# Patient Record
Sex: Female | Born: 1950 | Hispanic: Yes | Marital: Married | Smoking: Never smoker
Health system: Southern US, Community
[De-identification: ages and names within clinical notes are randomized; demographics above are authoritative.]

## PROBLEM LIST (undated history)

## (undated) DIAGNOSIS — E039 Hypothyroidism, unspecified: Secondary | ICD-10-CM

## (undated) DIAGNOSIS — I351 Nonrheumatic aortic (valve) insufficiency: Secondary | ICD-10-CM

## (undated) DIAGNOSIS — I5189 Other ill-defined heart diseases: Secondary | ICD-10-CM

## (undated) DIAGNOSIS — R7303 Prediabetes: Secondary | ICD-10-CM

## (undated) DIAGNOSIS — R768 Other specified abnormal immunological findings in serum: Secondary | ICD-10-CM

## (undated) DIAGNOSIS — I420 Dilated cardiomyopathy: Secondary | ICD-10-CM

## (undated) DIAGNOSIS — E079 Disorder of thyroid, unspecified: Secondary | ICD-10-CM

## (undated) DIAGNOSIS — I10 Essential (primary) hypertension: Secondary | ICD-10-CM

## (undated) DIAGNOSIS — I340 Nonrheumatic mitral (valve) insufficiency: Secondary | ICD-10-CM

## (undated) DIAGNOSIS — I071 Rheumatic tricuspid insufficiency: Secondary | ICD-10-CM

## (undated) DIAGNOSIS — I8393 Asymptomatic varicose veins of bilateral lower extremities: Secondary | ICD-10-CM

## (undated) HISTORY — DX: Asymptomatic varicose veins of bilateral lower extremities: I83.93

## (undated) HISTORY — PX: ABDOMINAL HYSTERECTOMY: SHX81

## (undated) HISTORY — PX: CORONARY ARTERY BYPASS GRAFT: SHX141

## (undated) HISTORY — DX: Other specified abnormal immunological findings in serum: R76.8

## (undated) HISTORY — PX: CHOLECYSTECTOMY: SHX55

## (undated) HISTORY — PX: WRIST FRACTURE SURGERY: SHX121

## (undated) HISTORY — PX: CARDIAC SURGERY: SHX584

## (undated) HISTORY — DX: Hypothyroidism, unspecified: E03.9

---

## 1982-03-06 DIAGNOSIS — Z8774 Personal history of (corrected) congenital malformations of heart and circulatory system: Secondary | ICD-10-CM

## 1982-03-06 HISTORY — DX: Personal history of (corrected) congenital malformations of heart and circulatory system: Z87.74

## 1982-03-06 HISTORY — PX: ASD REPAIR: SHX258

## 2004-04-06 ENCOUNTER — Ambulatory Visit: Payer: Self-pay

## 2006-01-26 ENCOUNTER — Emergency Department: Payer: Self-pay | Admitting: General Practice

## 2007-10-15 ENCOUNTER — Ambulatory Visit: Payer: Self-pay

## 2009-01-19 ENCOUNTER — Ambulatory Visit: Payer: Self-pay

## 2010-01-19 ENCOUNTER — Ambulatory Visit: Payer: Self-pay

## 2010-02-22 ENCOUNTER — Ambulatory Visit: Payer: Self-pay | Admitting: Family Medicine

## 2011-03-29 ENCOUNTER — Ambulatory Visit: Payer: Self-pay

## 2012-07-23 ENCOUNTER — Ambulatory Visit: Payer: Self-pay

## 2014-01-12 ENCOUNTER — Emergency Department: Payer: Self-pay | Admitting: Internal Medicine

## 2014-01-12 LAB — CBC
HCT: 39.1 % (ref 35.0–47.0)
HGB: 13.3 g/dL (ref 12.0–16.0)
MCH: 28 pg (ref 26.0–34.0)
MCHC: 33.9 g/dL (ref 32.0–36.0)
MCV: 83 fL (ref 80–100)
Platelet: 211 10*3/uL (ref 150–440)
RBC: 4.74 10*6/uL (ref 3.80–5.20)
RDW: 14.2 % (ref 11.5–14.5)
WBC: 9.4 10*3/uL (ref 3.6–11.0)

## 2014-01-12 LAB — BASIC METABOLIC PANEL
Anion Gap: 6 — ABNORMAL LOW (ref 7–16)
BUN: 16 mg/dL (ref 7–18)
CALCIUM: 8.4 mg/dL — AB (ref 8.5–10.1)
Chloride: 104 mmol/L (ref 98–107)
Co2: 30 mmol/L (ref 21–32)
Creatinine: 0.59 mg/dL — ABNORMAL LOW (ref 0.60–1.30)
EGFR (Non-African Amer.): >60
Glucose: 108 mg/dL — ABNORMAL HIGH (ref 65–99)
Osmolality: 281 (ref 275–301)
Potassium: 3.9 mmol/L (ref 3.5–5.1)
SODIUM: 140 mmol/L (ref 136–145)

## 2014-01-12 LAB — TROPONIN I: Troponin-I: <0.02 ng/mL

## 2014-01-13 LAB — TROPONIN I: Troponin-I: <0.02 ng/mL

## 2014-01-21 ENCOUNTER — Ambulatory Visit: Payer: Self-pay

## 2014-03-06 HISTORY — PX: NM MYOVIEW LTD: HXRAD82

## 2014-04-22 DIAGNOSIS — E039 Hypothyroidism, unspecified: Secondary | ICD-10-CM | POA: Insufficient documentation

## 2014-04-22 DIAGNOSIS — M79672 Pain in left foot: Secondary | ICD-10-CM

## 2014-04-22 DIAGNOSIS — I38 Endocarditis, valve unspecified: Secondary | ICD-10-CM | POA: Insufficient documentation

## 2014-04-22 DIAGNOSIS — G8929 Other chronic pain: Secondary | ICD-10-CM | POA: Insufficient documentation

## 2014-05-16 ENCOUNTER — Emergency Department: Payer: Self-pay | Admitting: Student

## 2014-05-18 ENCOUNTER — Emergency Department: Payer: Self-pay | Admitting: Emergency Medicine

## 2014-05-21 ENCOUNTER — Encounter: Payer: Self-pay | Admitting: Primary Care

## 2014-05-27 ENCOUNTER — Ambulatory Visit: Payer: Self-pay | Admitting: Primary Care

## 2014-06-08 ENCOUNTER — Emergency Department: Admit: 2014-06-08 | Disposition: A | Discharge status: Home / Self Care | Payer: Self-pay | Admitting: Student

## 2014-06-09 ENCOUNTER — Emergency Department
Admit: 2014-06-09 | Disposition: A | Discharge status: Home / Self Care | Payer: Self-pay | Admitting: Emergency Medicine

## 2014-06-30 ENCOUNTER — Ambulatory Visit
Admit: 2014-06-30 | Disposition: A | Discharge status: Home / Self Care | Payer: Self-pay | Attending: Orthopedic Surgery | Admitting: Orthopedic Surgery

## 2014-07-13 DIAGNOSIS — M47812 Spondylosis without myelopathy or radiculopathy, cervical region: Secondary | ICD-10-CM | POA: Insufficient documentation

## 2014-10-20 DIAGNOSIS — G629 Polyneuropathy, unspecified: Secondary | ICD-10-CM | POA: Insufficient documentation

## 2014-12-02 ENCOUNTER — Other Ambulatory Visit: Payer: Self-pay | Admitting: Primary Care

## 2014-12-02 DIAGNOSIS — Z13820 Encounter for screening for osteoporosis: Secondary | ICD-10-CM

## 2014-12-15 ENCOUNTER — Ambulatory Visit: Payer: Self-pay

## 2014-12-17 ENCOUNTER — Ambulatory Visit
Admission: RE | Admit: 2014-12-17 | Discharge: 2014-12-17 | Disposition: A | Discharge status: Home / Self Care | Payer: Medicare Other | Source: Ambulatory Visit | Attending: Primary Care | Admitting: Primary Care

## 2014-12-17 DIAGNOSIS — Z13820 Encounter for screening for osteoporosis: Secondary | ICD-10-CM | POA: Diagnosis not present

## 2014-12-17 DIAGNOSIS — Z780 Asymptomatic menopausal state: Secondary | ICD-10-CM | POA: Diagnosis not present

## 2014-12-17 DIAGNOSIS — M8580 Other specified disorders of bone density and structure, unspecified site: Secondary | ICD-10-CM | POA: Diagnosis not present

## 2015-12-20 ENCOUNTER — Other Ambulatory Visit: Payer: Self-pay | Admitting: Neurology

## 2015-12-20 DIAGNOSIS — M542 Cervicalgia: Secondary | ICD-10-CM

## 2015-12-27 ENCOUNTER — Other Ambulatory Visit: Payer: Self-pay | Admitting: Primary Care

## 2015-12-27 DIAGNOSIS — Z1231 Encounter for screening mammogram for malignant neoplasm of breast: Secondary | ICD-10-CM

## 2015-12-31 ENCOUNTER — Ambulatory Visit: Payer: Medicare Other

## 2016-01-17 ENCOUNTER — Other Ambulatory Visit: Payer: Self-pay | Admitting: Physical Medicine and Rehabilitation

## 2016-01-17 DIAGNOSIS — M5136 Other intervertebral disc degeneration, lumbar region: Secondary | ICD-10-CM

## 2016-01-27 ENCOUNTER — Ambulatory Visit: Payer: Medicare Other

## 2016-01-28 ENCOUNTER — Ambulatory Visit: Admission: RE | Admit: 2016-01-28 | Payer: Medicare Other | Source: Ambulatory Visit

## 2016-01-31 ENCOUNTER — Ambulatory Visit: Payer: Medicare Other | Attending: Primary Care

## 2016-02-02 ENCOUNTER — Ambulatory Visit
Admission: RE | Admit: 2016-02-02 | Discharge: 2016-02-02 | Disposition: A | Discharge status: Home / Self Care | Payer: Medicare Other | Source: Ambulatory Visit | Attending: Physical Medicine and Rehabilitation | Admitting: Physical Medicine and Rehabilitation

## 2016-02-02 DIAGNOSIS — M48061 Spinal stenosis, lumbar region without neurogenic claudication: Secondary | ICD-10-CM | POA: Diagnosis not present

## 2016-02-02 DIAGNOSIS — M5136 Other intervertebral disc degeneration, lumbar region: Secondary | ICD-10-CM | POA: Insufficient documentation

## 2016-03-03 ENCOUNTER — Emergency Department
Admission: EM | Admit: 2016-03-03 | Discharge: 2016-03-03 | Disposition: A | Discharge status: Home / Self Care | Payer: Medicare Other | Attending: Emergency Medicine | Admitting: Emergency Medicine

## 2016-03-03 ENCOUNTER — Emergency Department: Payer: Medicare Other

## 2016-03-03 ENCOUNTER — Encounter: Payer: Self-pay | Admitting: Emergency Medicine

## 2016-03-03 DIAGNOSIS — R0789 Other chest pain: Secondary | ICD-10-CM | POA: Diagnosis present

## 2016-03-03 DIAGNOSIS — I10 Essential (primary) hypertension: Secondary | ICD-10-CM | POA: Diagnosis not present

## 2016-03-03 HISTORY — DX: Essential (primary) hypertension: I10

## 2016-03-03 HISTORY — DX: Disorder of thyroid, unspecified: E07.9

## 2016-03-03 LAB — BASIC METABOLIC PANEL
Anion gap: 7 (ref 5–15)
BUN: 11 mg/dL (ref 6–20)
CHLORIDE: 105 mmol/L (ref 101–111)
CO2: 28 mmol/L (ref 22–32)
CREATININE: 0.53 mg/dL (ref 0.44–1.00)
Calcium: 8.9 mg/dL (ref 8.9–10.3)
GFR calc Af Amer: >60 mL/min (ref >60)
GFR calc non Af Amer: >60 mL/min (ref >60)
GLUCOSE: 105 mg/dL — AB (ref 65–99)
POTASSIUM: 3.5 mmol/L (ref 3.5–5.1)
Sodium: 140 mmol/L (ref 135–145)

## 2016-03-03 LAB — CBC
HEMATOCRIT: 37.8 % (ref 35.0–47.0)
Hemoglobin: 13.4 g/dL (ref 12.0–16.0)
MCH: 28.8 pg (ref 26.0–34.0)
MCHC: 35.4 g/dL (ref 32.0–36.0)
MCV: 81.2 fL (ref 80.0–100.0)
PLATELETS: 261 10*3/uL (ref 150–440)
RBC: 4.65 MIL/uL (ref 3.80–5.20)
RDW: 14.7 % — AB (ref 11.5–14.5)
WBC: 6.9 10*3/uL (ref 3.6–11.0)

## 2016-03-03 LAB — TROPONIN I
Troponin I: <0.03 ng/mL (ref <0.03)
Troponin I: <0.03 ng/mL (ref <0.03)

## 2016-03-03 MED ORDER — NAPROXEN 500 MG PO TABS: 500.0000 mg | ORAL_TABLET | Freq: Two times a day (BID) | ORAL | 2 refills | Status: DC

## 2016-03-03 MED ORDER — KETOROLAC TROMETHAMINE 30 MG/ML IJ SOLN
30.0000 mg | Freq: Once | INTRAMUSCULAR | Status: AC
  Administered 2016-03-03: 30 mg via INTRAMUSCULAR

## 2016-03-03 MED ORDER — KETOROLAC TROMETHAMINE 30 MG/ML IJ SOLN
INTRAMUSCULAR | Status: AC
  Administered 2016-03-03: 30 mg via INTRAMUSCULAR
  Filled 2016-03-03: qty 1

## 2016-03-03 NOTE — ED Provider Notes (Signed)
[facility] Emergency Department Provider Note   ____________________________________________    I have reviewed the triage vital signs and the nursing notes.   HISTORY  Chief Complaint Chest Pain  Interpreter used   HPI Kristi Conrad is a [age] y.o. female who presents with complaints of chest pain which started yesterday. She feels it is related to being hugged too tightly by her son and or possibly depression. She denies SI or HI. She denies diaphoresis or shortness of breath. No recent travel. No calf pain or swelling. No abdominal pain. She reports the pain as aching sensation worse with pressure on her left chest wall   Past Medical History:  Diagnosis Date  . Hypertension   . Thyroid disease     There are no active problems to display for this patient.   Past Surgical History:  Procedure Laterality Date  . ABDOMINAL HYSTERECTOMY    . CARDIAC SURGERY      Prior to Admission medications   Medication Sig Start Date End Date Taking? Authorizing Provider  ibuprofen (ADVIL,MOTRIN) 200 MG tablet Take 200 mg by mouth every 4 (four) hours as needed.   Yes Historical Provider, MD  naproxen (NAPROSYN) 500 MG tablet Take 1 tablet (500 mg total) by mouth 2 (two) times daily with a meal. 03/03/16   Jene Everyobert Aahana Elza, MD     Allergies Patient has no known allergies.  History reviewed. No pertinent family history.  Social History Social History  Substance Use Topics  . Smoking status: Never Smoker  . Smokeless tobacco: Never Used  . Alcohol use Yes    Review of Systems  Constitutional: No fever/chills  Cardiovascular: As above Respiratory: Denies shortness of breath. Gastrointestinal: No abdominal pain.  Genitourinary: Negative for dysuria. Musculoskeletal: Negative for back pain. Skin: Negative for rash. Neurological: Negative for headaches  10-point ROS otherwise  negative.  ____________________________________________   PHYSICAL EXAM:  VITAL SIGNS: ED Triage Vitals  Enc Vitals Group     BP 03/03/16 0955 (!) 151/70     Pulse Rate 03/03/16 0955 61     Resp 03/03/16 0955 18     Temp 03/03/16 0955 97.5 F (36.4 C)     Temp Source 03/03/16 0955 Oral     SpO2 03/03/16 0955 98 %     Weight 03/03/16 0956 145 lb (65.8 kg)     Height 03/03/16 0956 5\' 5"  (1.651 m)     Head Circumference --      Peak Flow --      Pain Score 03/03/16 0956 8     Pain Loc --      Pain Edu? --      Excl. in GC? --     Constitutional: Alert and oriented. No acute distress. Pleasant and interactive Eyes: Conjunctivae are normal.   Mouth/Throat: Mucous membranes are moist.   Neck:  Painless ROM Cardiovascular: Normal rate, regular rhythm. Grossly normal heart sounds.  Good peripheral circulation. Tender to palpation along the left lateral chest wall above the left breast, no rash Respiratory: Normal respiratory effort.  No retractions. Lungs CTAB. Gastrointestinal: Soft and nontender. No distention.  No CVA tenderness. Genitourinary: deferred Musculoskeletal: No lower extremity tenderness nor edema.  Warm and well perfused Neurologic: No gross focal neurologic deficits are appreciated.  Skin:  Skin is warm, dry and intact.  Psychiatric: Mood and affect are normal. Speech and behavior are normal.  ____________________________________________   LABS (all labs ordered are listed, but only  abnormal results are displayed)  Labs Reviewed  BASIC METABOLIC PANEL - Abnormal; Notable for the following:       Result Value   Glucose, Bld 105 (*)    All other components within normal limits  CBC - Abnormal; Notable for the following:    RDW 14.7 (*)    All other components within normal limits  TROPONIN I  TROPONIN I   ____________________________________________  EKG  ED ECG REPORT I, Jene EveryKINNER, Alandis Bluemel, the attending physician, personally viewed and interpreted  this ECG.  Date: 03/03/2016 EKG Time: 9:47 AM Rate: 54 Rhythm: Sinus bradycardia QRS Axis: normal Intervals: normal ST/T Wave abnormalities: normal Conduction Disturbances: none Narrative Interpretation: unremarkable  ____________________________________________  RADIOLOGY  Chest x-ray unremarkable ____________________________________________   PROCEDURES  Procedure(s) performed: No    Critical Care performed: No ____________________________________________   INITIAL IMPRESSION / ASSESSMENT AND PLAN / ED COURSE  Pertinent labs & imaging results that were available during my care of the patient were reviewed by me and considered in my medical decision making (see chart for details).  Patient well-appearing and in no acute distress. She reports the pain is improving without intervention. She does have tenderness along the chest wall. Her EKG and initial troponin are normal. We will recheck a troponin give NSAIDs for likely muscular skeletal pain.  Clinical Course   Second troponin is normal. Patient reports pain is almost entirely improved. We will discharge with supportive care and cardiology follow-up. Return precautions discussed at length. ____________________________________________   FINAL CLINICAL IMPRESSION(S) / ED DIAGNOSES  Final diagnoses:  Atypical chest pain      NEW MEDICATIONS STARTED DURING THIS VISIT:  New Prescriptions   NAPROXEN (NAPROSYN) 500 MG TABLET    Take 1 tablet (500 mg total) by mouth 2 (two) times daily with a meal.     Note:  This document was prepared using Dragon voice recognition software and may include unintentional dictation errors.    Jene Everyobert Anali Cabanilla, MD 03/03/16 [phone]

## 2016-03-03 NOTE — ED Notes (Signed)
ED Provider and interpreter at bedside. 

## 2016-03-03 NOTE — ED Triage Notes (Signed)
Pt reports last night when she was hugging her son, she passed out for 2-3 minutes when she woke up she started having left side chest pain. Pt states she felt throbbing and tightness in her chest on the left side. Pt has hx of heart surgery 40 years ago, states she did not feel dizzy or have any symptoms prior to passing out. Pt states the chest pain comes and goes and has nausea and shortness of breath. Pt did not take any heart medications or aspirin today. She did take her thyroid medications.

## 2016-03-07 ENCOUNTER — Ambulatory Visit
Admission: RE | Admit: 2016-03-07 | Discharge: 2016-03-07 | Disposition: A | Discharge status: Home / Self Care | Payer: Medicare Other | Source: Ambulatory Visit | Attending: Primary Care | Admitting: Primary Care

## 2016-03-07 DIAGNOSIS — Z1231 Encounter for screening mammogram for malignant neoplasm of breast: Secondary | ICD-10-CM | POA: Diagnosis not present

## 2016-12-27 DIAGNOSIS — E785 Hyperlipidemia, unspecified: Secondary | ICD-10-CM | POA: Insufficient documentation

## 2016-12-27 DIAGNOSIS — Z8774 Personal history of (corrected) congenital malformations of heart and circulatory system: Secondary | ICD-10-CM | POA: Insufficient documentation

## 2017-01-04 DIAGNOSIS — I429 Cardiomyopathy, unspecified: Secondary | ICD-10-CM

## 2017-01-04 HISTORY — DX: Cardiomyopathy, unspecified: I42.9

## 2017-01-04 HISTORY — PX: OTHER SURGICAL HISTORY: SHX169

## 2017-01-23 DIAGNOSIS — I420 Dilated cardiomyopathy: Secondary | ICD-10-CM | POA: Insufficient documentation

## 2017-01-30 ENCOUNTER — Other Ambulatory Visit: Payer: Self-pay | Admitting: Primary Care

## 2017-02-01 ENCOUNTER — Other Ambulatory Visit: Payer: Self-pay | Admitting: Primary Care

## 2017-02-01 DIAGNOSIS — Z1231 Encounter for screening mammogram for malignant neoplasm of breast: Secondary | ICD-10-CM

## 2017-03-08 ENCOUNTER — Ambulatory Visit
Admission: RE | Admit: 2017-03-08 | Discharge: 2017-03-08 | Disposition: A | Discharge status: Home / Self Care | Payer: Medicare Other | Source: Ambulatory Visit | Attending: Primary Care | Admitting: Primary Care

## 2017-03-08 DIAGNOSIS — Z1231 Encounter for screening mammogram for malignant neoplasm of breast: Secondary | ICD-10-CM | POA: Diagnosis present

## 2017-07-20 ENCOUNTER — Other Ambulatory Visit: Payer: Self-pay | Admitting: Primary Care

## 2017-07-20 DIAGNOSIS — R1084 Generalized abdominal pain: Secondary | ICD-10-CM

## 2017-07-20 DIAGNOSIS — R197 Diarrhea, unspecified: Secondary | ICD-10-CM

## 2017-07-26 ENCOUNTER — Ambulatory Visit
Admission: RE | Admit: 2017-07-26 | Discharge: 2017-07-26 | Disposition: A | Discharge status: Home / Self Care | Payer: Medicare Other | Source: Ambulatory Visit | Attending: Primary Care | Admitting: Primary Care

## 2017-07-26 DIAGNOSIS — N281 Cyst of kidney, acquired: Secondary | ICD-10-CM | POA: Insufficient documentation

## 2017-07-26 DIAGNOSIS — R1084 Generalized abdominal pain: Secondary | ICD-10-CM | POA: Diagnosis present

## 2017-07-26 DIAGNOSIS — R197 Diarrhea, unspecified: Secondary | ICD-10-CM | POA: Diagnosis not present

## 2017-08-03 DIAGNOSIS — R079 Chest pain, unspecified: Secondary | ICD-10-CM | POA: Insufficient documentation

## 2017-08-03 DIAGNOSIS — R0602 Shortness of breath: Secondary | ICD-10-CM | POA: Insufficient documentation

## 2017-08-03 DIAGNOSIS — R002 Palpitations: Secondary | ICD-10-CM | POA: Insufficient documentation

## 2017-08-29 ENCOUNTER — Encounter: Payer: Self-pay | Admitting: Gastroenterology

## 2017-08-29 ENCOUNTER — Ambulatory Visit (INDEPENDENT_AMBULATORY_CARE_PROVIDER_SITE_OTHER): Payer: Medicare Other | Admitting: Gastroenterology

## 2017-08-29 ENCOUNTER — Encounter (INDEPENDENT_AMBULATORY_CARE_PROVIDER_SITE_OTHER): Payer: Self-pay

## 2017-08-29 VITALS — BP 97/55 | HR 66 | Wt 145.8 lb

## 2017-08-29 DIAGNOSIS — K5900 Constipation, unspecified: Secondary | ICD-10-CM | POA: Diagnosis not present

## 2017-08-29 DIAGNOSIS — R109 Unspecified abdominal pain: Secondary | ICD-10-CM | POA: Diagnosis not present

## 2017-08-29 MED ORDER — BISACODYL 5 MG PO TBEC: 10.0000 mg | DELAYED_RELEASE_TABLET | Freq: Once | ORAL | 0 refills | Status: AC

## 2017-08-29 MED ORDER — PEG 3350-KCL-NA BICARB-NACL 420 G PO SOLR: 4000.0000 mL | Freq: Once | ORAL | 0 refills | Status: AC

## 2017-08-29 NOTE — Patient Instructions (Signed)
F/U 3-4 months Miralax 3 times weekly  Dieta rica en fibra High-Fiber Diet La fibra, tambin llamada fibra dietaria, es un tipo de carbohidrato que se encuentra en las frutas, las verduras, los cereales integrales y los frijoles. Una dieta rica en fibra puede tener muchos beneficios para la salud. El mdico puede recomendar una dieta rica en fibra para ayudar a:  Chief Strategy Officervitar el estreimiento. La fibra puede hacer que defeque con ms frecuencia.  Disminuir el nivel de colesterol.  Aliviar las hemorroides, la diverticulosis no complicada o el sndrome de colon irritable.  Evitar comer en exceso como parte de un plan para bajar de peso.  Evitar la enfermedad cardaca, la diabetes tipo 2 y ciertos cnceres.  En qu consiste el plan? El consumo diario recomendado de fibra incluye lo siguiente:  38gramos para los hombres menores de [place]50 aos.  30gramos para los hombres [address] de [place]50 aos.  25gramos para las mujeres menores de 50 aos.  21gramos para las Coca Colamujeres mayores de [place]50 aos.  Puede lograr el consumo diario recomendado de fibra si come una variedad de frutas, verduras, cereales y frijoles. El mdico tambin puede recomendar un suplemento de fibra si no es posible obtener suficiente fibra a travs de la dieta. Qu debo saber acerca de la dieta rica en fibra?  La eficacia de los suplementos de [place] no ha sido estudiada ampliamente, de modo que es mejor obtener fibra directamente de los alimentos.  Verifique siempre el contenido de fibra en la etiqueta de informacin nutricional de los alimentos preenvasados. Busque alimentos que contengan al menos 5gramos de fibra por porcin.  Consulte a un nutricionista si tiene preguntas sobre algunos alimentos especficos relacionados con su enfermedad, especialmente si estos alimentos no se mencionan a continuacin.  Aumente el consumo diario de fibra en forma gradual. Aumentar demasiado rpido el consumo de fibra dietaria puede provocar  distensin abdominal, clicos o gases.  Beber abundante agua. El [place] ayuda a Geophysicist/field seismologistdigerir la fibra. Qu alimentos puedo comer? Cereales Panes integrales. Cereal multigrano. Avena. Arroz integral. Gypsy Decantebada. Trigo burgol. Mijo. Magdalenas de salvado. Palomitas de maz. Galletas de centeno. Verduras Batatas. Espinaca. Col rizada. Alcachofas. Repollo. Brcoli. Guisantes. Zanahorias. Calabaza. Nils PyleFrutas Bayas. Peras. Manzanas. Naranjas. Aguacates. Ciruelas y pasas. Higos secos. Carnes y otras fuentes de protenas Frijoles blancos, colorados, pintos y porotos de soja. Guisantes secos. Lentejas. Frutos secos y semillas. Lcteos Yogur fortificado con Research scientist (life sciences)fibra. Bebidas Leche de soja fortificada con Bjorn Loserfibra. Jugo de naranja fortificado con Bjorn Loserfibra. Otros Barras de In[place] Millsfibra. Es posible que los productos que se enumeran ms arriba no sean una lista completa de las bebidas o los alimentos recomendados. Comunquese con el nutricionista para conocer ms opciones. Qu alimentos no se recomiendan? Cereales Pan blanco. Pastas hechas con Webb Lawsharina refinada. Arroz blanco. Verduras Papas fritas. Verduras enlatadas. Verduras muy cocidas. Frutas Jugo de frutas. Frutas cocidas coladas. Carnes y [address]ras fuentes de protenas Cortes de carne con alto contenido de Holiday representativegrasa. Aves o pescados fritos. Lcteos Leche. Yogur. Queso crema. PPG IndustriesCrema cida. Bebidas Gaseosas. Otros Tortas y pasteles. Mantequilla y aceites. Es posible que los productos que se enumeran ms arriba no sean una lista completa de los alimentos y las bebidas que se Theatre stage managerdeben evitar. Comunquese con el nutricionista para obtener ms informacin. Cules son algunos consejos para incluir alimentos ricos en fibra en la dieta?  Consuma una gran variedad de alimentos ricos en fibra.  Asegrese de que la mitad de todos los cereales consumidos cada da sean cereales integrales.  Reemplace los panes y cereales hechos  de harina refinada o harina blanca por panes y cereales  integrales.  Reemplace el arroz blanco por arroz integral, trigo burgol o mijo.  Comience Medical laboratory scientific officer con un desayuno rico en [place], como un cereal que contenga al menos 5gramos de fibra por porcin.  Use guisantes en lugar de carne en las sopas, ensaladas o pastas.  Coma refrigerios ricos en fibra, como frutos rojos, verduras crudas, frutos secos o palomitas de maz. Esta informacin no tiene Theme park manager el consejo del mdico. Asegrese de hacerle al mdico cualquier pregunta que tenga. Document Released: 02/20/2005 Document Revised: 06/28/2016 Document Reviewed: 08/05/2013 Elsevier Interactive Patient Education  [employer].

## 2017-08-29 NOTE — Progress Notes (Signed)
Kristi Conrad [address]  [address]  [place], [place] [address]  Main: [phone]  Fax: [phone]   Gastroenterology Consultation  Referring Provider:     Sandrea Hughs, NP Primary Care Physician:  Kristi Hughs, NP Primary Gastroenterologist:  Dr. Melodie Conrad Reason for Consultation:    Abdominal pain, diarrhea        HPI:    Chief Complaint  Patient presents with  . Establish Care    Kristi Conrad is a 67 y.o. y/o female referred for consultation & management  by Dr. Sandrea Hughs, NP.  Patient interviewed with Spanish interpreter.  Patient reports nausea vomiting, diarrhea, that resolved entirely 1 month ago.  Has not had the symptoms for a month now.  Prior to this, reports 1 month history of nausea vomiting and diarrhea.  Reports traveling to [place] just prior to the symptoms starting.  No blood in stool during the episodes.  Symptoms self resolved without any intervention.  No weight loss, or fever chills.  As of the last month, she reports formed bowel movements.  Also reports 1 to 2 days a week of constipation.  No abdominal pain over the last month.  No previous EGD or colonoscopy.  Past Medical History:  Diagnosis Date  . Hypertension   . Thyroid disease     Past Surgical History:  Procedure Laterality Date  . ABDOMINAL HYSTERECTOMY    . CARDIAC SURGERY      Prior to Admission medications   Medication Sig Start Date End Date Taking? Authorizing Provider  gabapentin (NEURONTIN) 300 MG capsule Take 1 capsule in the morning and 2 capsules at night. 02/05/17  Yes [provider]  aspirin EC 81 MG tablet Take by mouth.    [provider]  DULoxetine (CYMBALTA) 30 MG capsule  07/31/17   [provider]  ibuprofen (ADVIL,MOTRIN) 200 MG tablet Take 200 mg by mouth Conrad 4 (four) hours as needed.    [provider]  levothyroxine (SYNTHROID, LEVOTHROID) 125 MCG tablet Take by mouth.     [provider]  levothyroxine (SYNTHROID, LEVOTHROID) 50 MCG tablet  08/21/17   [provider]  losartan (COZAAR) 50 MG tablet  07/16/17   [provider]  metoprolol succinate (TOPROL-XL) 25 MG 24 hr tablet Take 25 mg by mouth daily. 08/03/17   [provider]  naproxen (NAPROSYN) 500 MG tablet Take 1 tablet (500 mg total) by mouth 2 (two) times daily with a meal. 03/03/16   Kristi Every, MD    No family history on file.   Social History   Tobacco Use  . Smoking status: Never Smoker  . Smokeless tobacco: Never Used  Substance Use Topics  . Alcohol use: Yes  . Drug use: Not on file    Allergies as of 08/29/2017  . (No Known Allergies)    Review of Systems:    All systems reviewed and negative except where noted in HPI.   Physical Exam:  BP (!) 97/55   Pulse 66   Wt 145 lb 12.8 oz (66.1 kg)   BMI 24.26 kg/m  No LMP recorded. Patient has had a hysterectomy. Psych:  Alert and cooperative. Normal mood and affect. General:   Alert,  Well-developed, well-nourished, pleasant and cooperative in NAD Head:  Normocephalic and atraumatic. Eyes:  Sclera clear, no icterus.   Conjunctiva pink. Ears:  Normal auditory acuity. Nose:  No deformity, discharge, or lesions. Mouth:  No deformity or lesions,oropharynx  pink & moist. Neck:  Supple; no masses or thyromegaly. Lungs:  Respirations even and unlabored.  Clear throughout to auscultation.   No wheezes, crackles, or rhonchi. No acute distress. Heart:  Regular rate and rhythm; no murmurs, clicks, rubs, or gallops. Abdomen:  Normal bowel sounds.  No bruits.  Soft, mildly tender to palpation right and left mid quadrant, and non-distended without masses, hepatosplenomegaly or hernias noted.  No guarding or rebound tenderness.    Msk:  Symmetrical without gross deformities. Good, equal movement & strength bilaterally. Pulses:  Normal pulses noted. Extremities:  No clubbing or edema.  No  cyanosis. Neurologic:  Alert and oriented x3;  grossly normal neurologically. Skin:  Intact without significant lesions or rashes. No jaundice. Lymph Nodes:  No significant cervical adenopathy. Psych:  Alert and cooperative. Normal mood and affect.   Labs: CBC    Component Value Date/Time   WBC 6.9 03/03/2016 0959   RBC 4.65 03/03/2016 0959   HGB 13.4 03/03/2016 0959   HGB 13.3 01/12/2014 2035   HCT 37.8 03/03/2016 0959   HCT 39.1 01/12/2014 2035   PLT 261 03/03/2016 0959   PLT 211 01/12/2014 2035   MCV 81.2 03/03/2016 0959   MCV 83 01/12/2014 2035   MCH 28.8 03/03/2016 0959   MCHC 35.4 03/03/2016 0959   RDW 14.7 (H) 03/03/2016 0959   RDW 14.2 01/12/2014 2035   CMP     Component Value Date/Time   NA 140 03/03/2016 0959   NA 140 01/12/2014 2035   K 3.5 03/03/2016 0959   K 3.9 01/12/2014 2035   CL 105 03/03/2016 0959   CL 104 01/12/2014 2035   CO2 28 03/03/2016 0959   CO2 30 01/12/2014 2035   GLUCOSE 105 (H) 03/03/2016 0959   GLUCOSE 108 (H) 01/12/2014 2035   BUN 11 03/03/2016 0959   BUN 16 01/12/2014 2035   CREATININE 0.53 03/03/2016 0959   CREATININE 0.59 (L) 01/12/2014 2035   CALCIUM 8.9 03/03/2016 0959   CALCIUM 8.4 (L) 01/12/2014 2035   GFRNONAA >60 03/03/2016 0959   GFRNONAA >60 01/12/2014 2035   GFRAA >60 03/03/2016 0959   GFRAA >60 01/12/2014 2035    Imaging Studies: No results found.  Assessment and Plan:   Kristi Conrad is a 67 y.o. y/o female has been referred for abdominal pain, and diarrhea that lasted for a month, and has completely resolved over the last month  Symptoms occurred after travel to [place], are most consistent with gastroenteritis that is completely resolved at this time  Patient educated on oral and food hygiene to prevent future episodes  Patient needs a screening colonoscopy as she has never had one.  This was discussed with her, and she is agreeable to scheduling this.  Abdominal exam showed tenderness to  palpation in the right and left mid quadrant.  This is possibly due to constipation However, due to abdominal pain that lasted a month last month, we will schedule for EGD along with the colonoscopy.  This is to evaluate for any peptic ulcer disease from her NSAID use, and obtain biopsies for H. pylori.  No melena or hematochezia, or alarm symptoms present at this time.  For intermittent constipation, it only occurs once or twice a week Start high-fiber diet Take MiraLAX 3 times a week, and titrated to goal of 1-2 soft problems daily  I have discussed alternative options, risks & benefits,  which include, but are not limited to, bleeding, infection, perforation,respiratory complication &  drug reaction.  The patient agrees with this plan & written consent will be obtained.      Dr Kristi Conrad

## 2017-08-29 NOTE — Addendum Note (Signed)
Addended by: Jackquline DenmarkIDGEWAY, Aydrian Halpin W on: 08/29/2017 05:14 PM   Modules accepted: Orders

## 2017-08-30 ENCOUNTER — Other Ambulatory Visit: Payer: Self-pay

## 2017-08-30 DIAGNOSIS — Z1211 Encounter for screening for malignant neoplasm of colon: Secondary | ICD-10-CM

## 2017-08-30 DIAGNOSIS — R109 Unspecified abdominal pain: Secondary | ICD-10-CM

## 2017-11-28 ENCOUNTER — Ambulatory Visit (INDEPENDENT_AMBULATORY_CARE_PROVIDER_SITE_OTHER): Payer: Medicare Other | Admitting: Gastroenterology

## 2017-11-28 ENCOUNTER — Encounter: Admission: RE | Payer: Self-pay | Source: Ambulatory Visit

## 2017-11-28 ENCOUNTER — Encounter: Payer: Self-pay | Admitting: Gastroenterology

## 2017-11-28 ENCOUNTER — Ambulatory Visit: Admission: RE | Admit: 2017-11-28 | Payer: Medicare Other | Source: Ambulatory Visit | Admitting: Gastroenterology

## 2017-11-28 VITALS — BP 115/58 | HR 59 | Wt 143.8 lb

## 2017-11-28 DIAGNOSIS — R109 Unspecified abdominal pain: Secondary | ICD-10-CM | POA: Diagnosis not present

## 2017-11-28 DIAGNOSIS — Z1211 Encounter for screening for malignant neoplasm of colon: Secondary | ICD-10-CM | POA: Diagnosis not present

## 2017-11-28 SURGERY — COLONOSCOPY WITH PROPOFOL
Anesthesia: General

## 2017-11-28 NOTE — Progress Notes (Signed)
Melodie Bouillon, MD [address]  [address]  [place], [place] [address]  Main: [phone]  Fax: [phone]   Primary Care Physician: Sandrea Hughs, NP  Primary Gastroenterologist:  Dr. Melodie Bouillon  Chief Complaint  Patient presents with  . Follow-up    HPI: Kristi Conrad is a 67 y.o. female here for follow-up due to abdominal pain and diarrhea.  Spanish interpreter used during entire conversation.  Patient's daughter also present in the room who understands English.  Patient continues to have 5-6 loose bowel movements a day without blood.  Reports having abdominal pain after eating pork but does not have abdominal pain otherwise.  No nausea or vomiting.  She was scheduled for an EGD and colonoscopy today after discussing this during her last visit, but canceled her procedures as she remembered that she had these procedures done in [place] in January or February 2019.  She states these were normal, and were done due to abdominal pain diarrhea and an infection at that time.  She does not have any of these records with her, and states she does not have any way of getting them.  No dysphagia.  She was given Metamucil by another provider, but is not taking it because she is having multiple bowel movements a day.  Current Outpatient Medications  Medication Sig Dispense Refill  . aspirin EC 81 MG tablet Take by mouth.    . DULoxetine (CYMBALTA) 30 MG capsule     . gabapentin (NEURONTIN) 300 MG capsule Take 1 capsule in the morning and 2 capsules at night.    Marland Kitchen ibuprofen (ADVIL,MOTRIN) 200 MG tablet Take 200 mg by mouth every 4 (four) hours as needed.    Marland Kitchen levothyroxine (SYNTHROID, LEVOTHROID) 125 MCG tablet Take by mouth.    . levothyroxine (SYNTHROID, LEVOTHROID) 50 MCG tablet     . losartan (COZAAR) 50 MG tablet     . metoprolol succinate (TOPROL-XL) 25 MG 24 hr tablet Take 25 mg by mouth daily.  11  . naproxen (NAPROSYN) 500 MG tablet Take 1 tablet (500 mg  total) by mouth 2 (two) times daily with a meal. 20 tablet 2   No current facility-administered medications for this visit.     Allergies as of 11/28/2017  . (No Known Allergies)    ROS:  General: Negative for anorexia, weight loss, fever, chills, fatigue, weakness. ENT: Negative for hoarseness, difficulty swallowing , nasal congestion. CV: Negative for chest pain, angina, palpitations, dyspnea on exertion, peripheral edema.  Respiratory: Negative for dyspnea at rest, dyspnea on exertion, cough, sputum, wheezing.  GI: See history of present illness. GU:  Negative for dysuria, hematuria, urinary incontinence, urinary frequency, nocturnal urination.  Endo: Negative for unusual weight change.    Physical Examination:   BP (!) 115/58   Pulse (!) 59   Wt 143 lb 12.8 oz (65.2 kg)   BMI 23.93 kg/m   General: Well-nourished, well-developed in no acute distress.  Eyes: No icterus. Conjunctivae pink. Mouth: Oropharyngeal mucosa moist and pink , no lesions erythema or exudate. Neck: Supple, Trachea midline Abdomen: Bowel sounds are normal, nontender, nondistended, no hepatosplenomegaly or masses, no abdominal bruits or hernia , no rebound or guarding.   Extremities: No lower extremity edema. No clubbing or deformities. Neuro: Alert and oriented x 3.  Grossly intact. Skin: Warm and dry, no jaundice.   Psych: Alert and cooperative, normal mood and affect.   Labs: CMP     Component Value Date/Time  NA 140 03/03/2016 0959   NA 140 01/12/2014 2035   K 3.5 03/03/2016 0959   K 3.9 01/12/2014 2035   CL 105 03/03/2016 0959   CL 104 01/12/2014 2035   CO2 28 03/03/2016 0959   CO2 30 01/12/2014 2035   GLUCOSE 105 (H) 03/03/2016 0959   GLUCOSE 108 (H) 01/12/2014 2035   BUN 11 03/03/2016 0959   BUN 16 01/12/2014 2035   CREATININE 0.53 03/03/2016 0959   CREATININE 0.59 (L) 01/12/2014 2035   CALCIUM 8.9 03/03/2016 0959   CALCIUM 8.4 (L) 01/12/2014 2035   GFRNONAA >60 03/03/2016 0959     GFRNONAA >60 01/12/2014 2035   GFRAA >60 03/03/2016 0959   GFRAA >60 01/12/2014 2035   Lab Results  Component Value Date   WBC 6.9 03/03/2016   HGB 13.4 03/03/2016   HCT 37.8 03/03/2016   MCV 81.2 03/03/2016   PLT 261 03/03/2016    Imaging Studies: No results found.  Assessment and Plan:   Kristi Conrad is a 67 y.o. y/o female here for follow-up of abdominal pain and diarrhea  Due to ongoing symptoms and negative infectious work-up by primary care provider in May 2019 we discussed several options in detail  We discussed that we do not have any of [place] previous [place] from [place] to verify what was seen during the colonoscopy, what was the prep during the colonoscopy and what the extent of the exam was.  We discussed that since she had a diagnostic colonoscopy at the time of symptoms and diarrhea, it was likely not a screening exam, so if there are polyps present in her colon at this time, they may grow into cancer in the future, albeit these can take years to grow.  I discussed that since we do not know at this time if there were polyps present or removed during her colonoscopy in [place], that we cannot safely say that she may not develop cancer in the near future if a colonoscopy is not done to evaluate for and remove any polyps.  We discussed the options of proceeding with screening colonoscopy and benefits and risks associated with that, versus repeating her stool tests at this time and conservative management.  All the above discussion took place with the help of a Spanish interpreter  Patient would like to proceed with screening colonoscopy, at which time biopsies can be done for microscopic colitis and inflammation can be ruled out as well  She is also agreeable to proceeding with EGD due to her abdominal pain and we can obtain biopsies for H. Pylori  I have encouraged her to obtain her records from [place] if she travels back there in the future and she  verbalized understanding  I have discussed alternative options, risks & benefits,  which include, but are not limited to, bleeding, infection, perforation,respiratory complication & drug reaction.  The patient agrees with this plan & written consent will be obtained.     Dr Melodie BouillonVarnita Keldrick Pomplun

## 2017-11-30 ENCOUNTER — Ambulatory Visit: Payer: Medicare Other | Admitting: Anesthesiology

## 2017-11-30 ENCOUNTER — Ambulatory Visit: Payer: Medicare Other

## 2017-11-30 ENCOUNTER — Ambulatory Visit
Admission: RE | Admit: 2017-11-30 | Discharge: 2017-11-30 | Disposition: A | Discharge status: Home / Self Care | Payer: Medicare Other | Source: Ambulatory Visit | Attending: Gastroenterology | Admitting: Gastroenterology

## 2017-11-30 ENCOUNTER — Encounter
Admission: RE | Disposition: A | Discharge status: Home / Self Care | Payer: Self-pay | Source: Ambulatory Visit | Attending: Gastroenterology

## 2017-11-30 ENCOUNTER — Telehealth: Payer: Self-pay | Admitting: Gastroenterology

## 2017-11-30 DIAGNOSIS — K317 Polyp of stomach and duodenum: Secondary | ICD-10-CM | POA: Insufficient documentation

## 2017-11-30 DIAGNOSIS — Z79899 Other long term (current) drug therapy: Secondary | ICD-10-CM | POA: Insufficient documentation

## 2017-11-30 DIAGNOSIS — K529 Noninfective gastroenteritis and colitis, unspecified: Secondary | ICD-10-CM | POA: Diagnosis not present

## 2017-11-30 DIAGNOSIS — K2289 Other specified disease of esophagus: Secondary | ICD-10-CM

## 2017-11-30 DIAGNOSIS — Z1211 Encounter for screening for malignant neoplasm of colon: Secondary | ICD-10-CM | POA: Insufficient documentation

## 2017-11-30 DIAGNOSIS — I10 Essential (primary) hypertension: Secondary | ICD-10-CM | POA: Diagnosis not present

## 2017-11-30 DIAGNOSIS — K3189 Other diseases of stomach and duodenum: Secondary | ICD-10-CM

## 2017-11-30 DIAGNOSIS — E039 Hypothyroidism, unspecified: Secondary | ICD-10-CM | POA: Insufficient documentation

## 2017-11-30 DIAGNOSIS — K219 Gastro-esophageal reflux disease without esophagitis: Secondary | ICD-10-CM | POA: Diagnosis not present

## 2017-11-30 DIAGNOSIS — R1084 Generalized abdominal pain: Secondary | ICD-10-CM | POA: Diagnosis not present

## 2017-11-30 DIAGNOSIS — Z7982 Long term (current) use of aspirin: Secondary | ICD-10-CM | POA: Insufficient documentation

## 2017-11-30 DIAGNOSIS — K228 Other specified diseases of esophagus: Secondary | ICD-10-CM | POA: Diagnosis not present

## 2017-11-30 DIAGNOSIS — R109 Unspecified abdominal pain: Secondary | ICD-10-CM

## 2017-11-30 HISTORY — PX: COLONOSCOPY WITH PROPOFOL: SHX5780

## 2017-11-30 HISTORY — PX: ESOPHAGOGASTRODUODENOSCOPY (EGD) WITH PROPOFOL: SHX5813

## 2017-11-30 LAB — POCT I-STAT CREATININE: Creatinine, Ser: 0.6 mg/dL (ref 0.44–1.00)

## 2017-11-30 SURGERY — COLONOSCOPY WITH PROPOFOL
Anesthesia: General

## 2017-11-30 MED ORDER — PROPOFOL 500 MG/50ML IV EMUL
INTRAVENOUS | Status: DC | PRN
  Administered 2017-11-30: 120 ug/kg/min via INTRAVENOUS

## 2017-11-30 MED ORDER — SODIUM CHLORIDE 0.9 % IV SOLN
INTRAVENOUS | Status: DC
  Administered 2017-11-30: 1000 mL via INTRAVENOUS

## 2017-11-30 MED ORDER — PROPOFOL 500 MG/50ML IV EMUL
INTRAVENOUS | Status: AC
  Filled 2017-11-30: qty 50

## 2017-11-30 MED ORDER — MIDAZOLAM HCL 5 MG/5ML IJ SOLN
INTRAMUSCULAR | Status: DC | PRN
  Administered 2017-11-30: 1 mg via INTRAVENOUS

## 2017-11-30 MED ORDER — LIDOCAINE HCL (PF) 1 % IJ SOLN
INTRAMUSCULAR | Status: AC
  Administered 2017-11-30: 0.3 mL
  Filled 2017-11-30: qty 2

## 2017-11-30 MED ORDER — FENTANYL CITRATE (PF) 100 MCG/2ML IJ SOLN
INTRAMUSCULAR | Status: DC | PRN
  Administered 2017-11-30: 50 ug via INTRAVENOUS

## 2017-11-30 MED ORDER — LIDOCAINE HCL (PF) 2 % IJ SOLN
INTRAMUSCULAR | Status: AC
  Filled 2017-11-30: qty 10

## 2017-11-30 MED ORDER — MIDAZOLAM HCL 2 MG/2ML IJ SOLN
INTRAMUSCULAR | Status: AC
  Filled 2017-11-30: qty 2

## 2017-11-30 MED ORDER — IOPAMIDOL (ISOVUE-300) INJECTION 61%
100.0000 mL | Freq: Once | INTRAVENOUS | Status: AC | PRN
  Administered 2017-11-30: 100 mL via INTRAVENOUS

## 2017-11-30 MED ORDER — PROPOFOL 10 MG/ML IV BOLUS
INTRAVENOUS | Status: DC | PRN
  Administered 2017-11-30 (×2): 32.7 mg via INTRAVENOUS

## 2017-11-30 MED ORDER — FENTANYL CITRATE (PF) 100 MCG/2ML IJ SOLN
INTRAMUSCULAR | Status: AC
  Filled 2017-11-30: qty 2

## 2017-11-30 NOTE — Op Note (Signed)
[facility] Gastroenterology Patient Name: Kristi Conrad [facility] Procedure Date: 11/30/2017 9:44 AM MRN: [id number] Account #: [id number] Date of Birth: September 05, 1950 Admit Type: Outpatient Age: 67 Room: [facility] ENDO ROOM 2 Gender: Female Note Status: Finalized Procedure:            Colonoscopy Indications:          Screening for colorectal malignant neoplasm, Incidental                        - Diarrhea Providers:            Samik Balkcom B. Bonna Gains MD, MD Referring MD:         Neomia Dear (Referring MD) Medicines:            Monitored Anesthesia Care Complications:        No immediate complications. Procedure:            Pre-Anesthesia Assessment:                       - Prior to the procedure, a History and Physical was                        performed, and patient medications, allergies and                        sensitivities were reviewed. The patient's tolerance of                        previous anesthesia was reviewed.                       - The risks and benefits of the procedure and the                        sedation options and risks were discussed with the                        patient. All questions were answered and informed                        consent was obtained.                       - Patient identification and proposed procedure were                        verified prior to the procedure by the physician, the                        nurse, the anesthesiologist, the anesthetist and the                        technician. The procedure was verified in the                        pre-procedure area in the procedure room in the                        endoscopy suite.                       -  Prophylactic Antibiotics: The patient does not                        require prophylactic antibiotics.                       - ASA Grade Assessment: II - A patient with mild                        systemic disease.                       - After reviewing the  risks and benefits, the patient                        was deemed in satisfactory condition to undergo the                        procedure.                       - Monitored anesthesia care was determined to be                        medically necessary for this procedure based on review                        of the patient's medical history, medications, and                        prior anesthesia history.                       - The anesthesia plan was to use monitored anesthesia                        care (MAC).                       After obtaining informed consent, the colonoscope was                        passed under direct vision. Throughout the procedure,                        the patient's blood pressure, pulse, and oxygen                        saturations were monitored continuously. The                        Colonoscope was introduced through the anus and                        advanced to the the cecum, identified by appendiceal                        orifice and ileocecal valve. The colonoscopy was                        performed with ease. The patient tolerated the  procedure well. The quality of the bowel preparation                        was good. Findings:      The perianal and digital rectal examinations were normal.      Localized mild inflammation characterized by erosions, erythema and       shallow ulcerations was found appendiceal orifice. Biopsies were taken       with a cold forceps for histology. The biopsies were obtained of the       peri-appendiceal area to evaluate for appendicitis. The orifice itself       was not biopsied to prevent further inflammation.      The rectum, sigmoid colon, descending colon, transverse colon, ascending       colon and cecum appeared normal. Biopsies for histology were taken with       a cold forceps from the entire colon for evaluation of microscopic       colitis.      The exam was otherwise  normal throughout the examined colon.      The retroflexed view of the distal rectum and anal verge was normal and       showed no anal or rectal abnormalities. Impression:           - Localized mild inflammation was found at the                        appendiceal orifice. Biopsied.                       - The rectum, sigmoid colon, descending colon,                        transverse colon, ascending colon and cecum are normal.                        Biopsied.                       - The distal rectum and anal verge are normal on                        retroflexion view. Recommendation:       - CT abdomen today to evaluate for appendicitis.                       - Await pathology results.                       - Return to my office as previously scheduled.                       - Continue present medications.                       - Return to primary care physician as previously                        scheduled.                       - The findings and recommendations were discussed with  the patient.                       - The findings and recommendations were discussed with                        the patient's family. Procedure Code(s):    --- Professional ---                       [id number], Colonoscopy, flexible; with biopsy, single or                        multiple Diagnosis Code(s):    --- Professional ---                       Z12.11, Encounter for screening for malignant neoplasm                        of colon                       K52.9, Noninfective gastroenteritis and colitis,                        unspecified CPT copyright 2017 American Medical Association. All rights reserved. The codes documented in this report are preliminary and upon coder review may  be revised to meet current compliance requirements.  [name], MD Margretta Sidle B. Bonna Gains MD, MD 11/30/2017 10:57:08 AM This report has been signed electronically. Number of Addenda: 0 Note  Initiated On: 11/30/2017 9:44 AM Scope Withdrawal Time: 0 hours 12 minutes 41 seconds  Total Procedure Duration: 0 hours 16 minutes 0 seconds  Estimated Blood Loss: Estimated blood loss: none.      [facility]

## 2017-11-30 NOTE — Op Note (Signed)
[facility] Gastroenterology Patient Name: Kristi Conrad [facility] Procedure Date: 11/30/2017 9:47 AM MRN: [id number] Account #: [id number] Date of Birth: 16-Aug-1950 Admit Type: Outpatient Age: 67 Room: [facility] ENDO ROOM 2 Gender: Female Note Status: Finalized Procedure:            Upper GI endoscopy Indications:          Generalized abdominal pain Providers:            Kyle Stansell B. Maximino Greenland MD, MD Referring MD:         Dorcas Mcmurray (Referring MD) Medicines:            Monitored Anesthesia Care Complications:        No immediate complications. Procedure:            Pre-Anesthesia Assessment:                       - Prior to the procedure, a History and Physical was                        performed, and patient medications, allergies and                        sensitivities were reviewed. The patient's tolerance of                        previous anesthesia was reviewed.                       - The risks and benefits of the procedure and the                        sedation options and risks were discussed with the                        patient. All questions were answered and informed                        consent was obtained.                       - Patient identification and proposed procedure were                        verified prior to the procedure by the physician, the                        nurse, the anesthesiologist, the anesthetist and the                        technician. The procedure was verified in the procedure                        room.                       - ASA Grade Assessment: II - A patient with mild                        systemic disease.  After obtaining informed consent, the endoscope was                        passed under direct vision. Throughout the procedure,                        the patient's blood pressure, pulse, and oxygen                        saturations were monitored continuously. The Endoscope                         was introduced through the mouth, and advanced to the                        second part of duodenum. The upper GI endoscopy was                        accomplished with ease. The patient tolerated the                        procedure well. Findings:      The Z-line was irregular. Mucosa was biopsied with a cold forceps for       histology in 4 quadrants at the gastroesophageal junction.      Patchy mildly erythematous mucosa without bleeding was found in the       gastric antrum. Biopsies were taken with a cold forceps for histology.       Biopsies were obtained in the gastric body, at the incisura and in the       gastric antrum with cold forceps for histology.      A single 6 mm sessile polyp with no bleeding was found in the second       portion of the duodenum. The polyp was removed with a cold snare. Polyp       resection was incomplete. The resected tissue was retrieved. The polyp       was just distal to the duodenal bulb, at the proximal end of the 2nd       portion in the duodenal sweep. It was at a very angulated and difficult       position. It was planned to be removed with hot snare but as the polyp       was placed in the snare, the scope and snare moved back due to its       angulated position and the top part of the polyp was removed via cold       snare only. Reexamination showed possible tissue base left at the site       which could not be secured in the snare again due to its size and the       angulation. Hot forceps are not available at our facility to remove it       with that technique. No bleeding present after rexamination of polyp. No       need for clipping or hemostasis.      The duodenal bulb, second portion of the duodenum and examined duodenum       were normal otherwise. Impression:           - Z-line irregular. Biopsied.                       -  Erythematous mucosa in the antrum. Biopsied.                       - A single duodenal  polyp. Incomplete resection.                        Resected tissue retrieved.                       - If the duodenal polyp shows adenoma patient will need                        a repeat EGD to reexamine the area and remove any                        remaining polyp. Availability of hot forceps on next                        procedure may help in removal.                       - Normal duodenal bulb, second portion of the duodenum                        and examined duodenum.                       - Biopsies were obtained in the gastric body, at the                        incisura and in the gastric antrum. Recommendation:       - Await pathology results.                       - Discharge patient to home (with escort).                       - Advance diet as tolerated.                       - Continue present medications.                       - Patient has a contact number available for                        emergencies. The signs and symptoms of potential                        delayed complications were discussed with the patient.                        Return to normal activities tomorrow. Written discharge                        instructions were provided to the patient.                       - Discharge patient to home (with escort).                       - The findings  and recommendations were discussed with                        the patient.                       - The findings and recommendations were discussed with                        the patient's family.                       - Return to my office in 4 weeks. Procedure Code(s):    --- Professional ---                       [phone], Esophagogastroduodenoscopy, flexible, transoral;                        with removal of tumor(s), polyp(s), or other lesion(s)                        by snare technique                       [id number], 59, Esophagogastroduodenoscopy, flexible,                        transoral; with biopsy, single  or multiple Diagnosis Code(s):    --- Professional ---                       K22.8, Other specified diseases of esophagus                       K31.89, Other diseases of stomach and duodenum                       K31.7, Polyp of stomach and duodenum                       R10.84, Generalized abdominal pain CPT copyright 2017 American Medical Association. All rights reserved. The codes documented in this report are preliminary and upon coder review may  be revised to meet current compliance requirements.  Melodie Bouillon, MD Michel Bickers B. Maximino Greenland MD, MD 11/30/2017 10:32:47 AM This report has been signed electronically. Number of Addenda: 0 Note Initiated On: 11/30/2017 9:47 AM Estimated Blood Loss: Estimated blood loss: none.      [facility]

## 2017-11-30 NOTE — Anesthesia Postprocedure Evaluation (Signed)
Anesthesia Post Note  Patient: Kristi Conrad  Procedure(s) Performed: COLONOSCOPY WITH PROPOFOL (N/A ) ESOPHAGOGASTRODUODENOSCOPY (EGD) WITH PROPOFOL (N/A )  Patient location during evaluation: Endoscopy Anesthesia Type: General Level of consciousness: awake and alert Pain management: pain level controlled Vital Signs Assessment: post-procedure vital signs reviewed and stable Respiratory status: spontaneous breathing and respiratory function stable Cardiovascular status: stable Anesthetic complications: no     Last Vitals:  Vitals:   11/30/17 0842 11/30/17 1056  Pulse: (!) 55   Resp: 17   Temp: (!) 35.8 C (!) 36.1 C  SpO2: 99%     Last Pain:  Vitals:   11/30/17 1101  TempSrc:   PainSc: 0-No pain                 Laren Whaling K

## 2017-11-30 NOTE — Progress Notes (Signed)
CT does not show appendicitis. Pt and family (Son Marquita Palms who was present with patient for procedure today) informed of results. Pt asymptomatic. Follow up in clinic in 2-3 weeks. Pt and family educated about alarm symptoms to call back with.

## 2017-11-30 NOTE — Anesthesia Preprocedure Evaluation (Signed)
Anesthesia Evaluation  Patient identified by MRN, date of birth, ID band Patient awake    Reviewed: Allergy & Precautions, NPO status , Patient's Chart, lab work & pertinent test results  History of Anesthesia Complications Negative for: history of anesthetic complications  Airway Mallampati: II       Dental   Pulmonary neg sleep apnea, neg COPD,           Cardiovascular hypertension, Pt. on medications and Pt. on home beta blockers + angina (-) Past MI and (-) CHF (-) dysrhythmias (-) Valvular Problems/Murmurs  Pt with dilated cardiomyopathy LVEF 40%, negative stress echo 11/18.   Neuro/Psych neg Seizures    GI/Hepatic Neg liver ROS, GERD  ,  Endo/Other  neg diabetesHypothyroidism   Renal/GU negative Renal ROS     Musculoskeletal   Abdominal   Peds  Hematology   Anesthesia Other Findings   Reproductive/Obstetrics                           Anesthesia Physical Anesthesia Plan  ASA: III  Anesthesia Plan: General   Post-op Pain Management:    Induction:   PONV Risk Score and Plan: 3 and Propofol infusion, TIVA and Midazolam  Airway Management Planned: Nasal Cannula  Additional Equipment:   Intra-op Plan:   Post-operative Plan:   Informed Consent: I have reviewed the patients History and Physical, chart, labs and discussed the procedure including the risks, benefits and alternatives for the proposed anesthesia with the patient or authorized representative who has indicated his/her understanding and acceptance.     Plan Discussed with:   Anesthesia Plan Comments:         Anesthesia Quick Evaluation

## 2017-11-30 NOTE — Anesthesia Post-op Follow-up Note (Signed)
Anesthesia QCDR form completed.        

## 2017-11-30 NOTE — Transfer of Care (Signed)
Immediate Anesthesia Transfer of Care Note  Patient: Kristi Conrad  Procedure(s) Performed: COLONOSCOPY WITH PROPOFOL (N/A ) ESOPHAGOGASTRODUODENOSCOPY (EGD) WITH PROPOFOL (N/A )  Patient Location: PACU and Endoscopy Unit  Anesthesia Type:General  Level of Consciousness: sedated  Airway & Oxygen Therapy: Patient Spontanous Breathing  Post-op Assessment: Report given to RN and Post -op Vital signs reviewed and stable  Post vital signs: stable  Last Vitals:  Vitals Value Taken Time  BP 107/63 11/30/2017 10:58 AM  Temp 36.1 C 11/30/2017 10:56 AM  Pulse 54 11/30/2017 11:01 AM  Resp 17 11/30/2017 11:01 AM  SpO2 98 % 11/30/2017 11:01 AM  Vitals shown include unvalidated device data.  Last Pain:  Vitals:   11/30/17 1101  TempSrc:   PainSc: 0-No pain         Complications: No apparent anesthesia complications

## 2017-12-03 ENCOUNTER — Encounter: Payer: Self-pay | Admitting: Gastroenterology

## 2017-12-04 LAB — SURGICAL PATHOLOGY

## 2018-02-08 ENCOUNTER — Telehealth: Payer: Self-pay

## 2018-02-08 NOTE — Telephone Encounter (Signed)
Pt notified of her results and need for referral to [facility] and that they will be contacting her for an appt. To contact office if she has not heard anything in 1-2 weeks from them. Also to make a f/u appt with us in 3-4 weeks or next available. Interpreter was used for this conversation through [employer].

## 2018-02-08 NOTE — Telephone Encounter (Signed)
-----   Message from Pasty SpillersVarnita B Tahiliani, MD sent at 12/14/2017  3:59 PM EDT ----- Eunice Blaseebbie please let patient know, her biopsies were benign. Refer [facility] to [facility] "Appendiceal orifice inflammation seen on colonoscopy, discuss if appendectomy is appropriate". Follow up with me in 3-4 weeks.

## 2018-02-12 ENCOUNTER — Ambulatory Visit: Payer: Medicare Other | Admitting: Gastroenterology

## 2018-02-12 ENCOUNTER — Telehealth: Payer: Self-pay | Admitting: Gastroenterology

## 2018-02-12 NOTE — Telephone Encounter (Signed)
Put in interpreter services for r/s appt.

## 2018-03-21 ENCOUNTER — Ambulatory Visit (INDEPENDENT_AMBULATORY_CARE_PROVIDER_SITE_OTHER): Payer: Medicare Other | Admitting: Gastroenterology

## 2018-03-21 ENCOUNTER — Encounter: Payer: Self-pay | Admitting: Gastroenterology

## 2018-03-21 VITALS — BP 154/76 | HR 61 | Ht 60.0 in | Wt 153.4 lb

## 2018-03-21 DIAGNOSIS — R1084 Generalized abdominal pain: Secondary | ICD-10-CM | POA: Diagnosis not present

## 2018-03-21 NOTE — Progress Notes (Signed)
Kristi BouillonVarnita Livi Mcgann, MD [address]  [address]  [place] [address]  Main: [phone]  Fax: [phone]   Primary Care Physician: Kristi Hughsubio, Jessica, NP   Chief Complaint  Patient presents with  . Follow-up    abdominal pain    HPI: Kristi Conrad is a 68 y.o. female here for follow-up of abdominal pain.  Patient states abdominal pain has resolved.  She is using Metamucil as needed and states that helps immensely. The patient denies abdominal or flank pain, anorexia, nausea or vomiting, dysphagia, change in bowel habits or black or bloody stools or weight loss.  Due to her abdominal pain and diarrhea she underwent work-up with EGD and colonoscopy.  EGD September 2019.  Irregular Z line.  Gastric erythema.  Single duodenal polyp was incomplete resection.  Pathology from the polyp showed it to be an inflammatory polyp.  Stomach biopsies negative for H. pylori.  GE junction biopsy showed no intestinal metaplasia.  Colonoscopy September 2019.  Mild inflammation characterized by erosions, erythema and shallow ulcerations sign at the appendiceal orifice.  Biopsies obtained.  Exam otherwise normal.  Colon biopsies showed focal active colitis and background reactive lymphoid aggregates.  Random colon biopsies did not show microscopic colitis.  Patient underwent a CT abdomen pelvis with contrast on the same day as a colonoscopy to rule out appendicitis given the appendiceal changes during the colonoscopy.  It did not show any radiographic evidence of appendicitis or any significant abnormality.  [facility] to discuss if appendectomy is appropriate given her findings on colonoscopy.  She was seen by Dr. Derrell Lollingamirez who did not recommend appendectomy.  Current Outpatient Medications  Medication Sig Dispense Refill  . losartan (COZAAR) 25 MG tablet Take by mouth.    Marland Kitchen. aspirin EC 81 MG tablet Take by mouth.    . DULoxetine (CYMBALTA) 30 MG  capsule     . gabapentin (NEURONTIN) 300 MG capsule Take 1 capsule in the morning and 2 capsules at night.    Marland Kitchen. ibuprofen (ADVIL,MOTRIN) 200 MG tablet Take 200 mg by mouth every 4 (four) hours as needed.    Marland Kitchen. levothyroxine (SYNTHROID, LEVOTHROID) 50 MCG tablet     . losartan (COZAAR) 50 MG tablet     . metoprolol succinate (TOPROL-XL) 25 MG 24 hr tablet Take 25 mg by mouth daily.  11  . naproxen (NAPROSYN) 500 MG tablet Take 1 tablet (500 mg total) by mouth 2 (two) times daily with a meal. 20 tablet 2   No current facility-administered medications for this visit.     Allergies as of 03/21/2018  . (No Known Allergies)    ROS:  General: Negative for anorexia, weight loss, fever, chills, fatigue, weakness. ENT: Negative for hoarseness, difficulty swallowing , nasal congestion. CV: Negative for chest pain, angina, palpitations, dyspnea on exertion, peripheral edema.  Respiratory: Negative for dyspnea at rest, dyspnea on exertion, cough, sputum, wheezing.  GI: See history of present illness. GU:  Negative for dysuria, hematuria, urinary incontinence, urinary frequency, nocturnal urination.  Endo: Negative for unusual weight change.    Physical Examination:   BP (!) 154/76   Pulse 61   Ht 5' (1.524 m)   Wt 153 lb 6.4 oz (69.6 kg)   BMI 29.96 kg/m   General: Well-nourished, well-developed in no acute distress.  Eyes: No icterus. Conjunctivae pink. Mouth: Oropharyngeal mucosa moist and pink , no lesions erythema or exudate. Neck: Supple, Trachea midline Abdomen: Bowel  sounds are normal, nontender, nondistended, no hepatosplenomegaly or masses, no abdominal bruits or hernia , no rebound or guarding.   Extremities: No lower extremity edema. No clubbing or deformities. Neuro: Alert and oriented x 3.  Grossly intact. Skin: Warm and dry, no jaundice.   Psych: Alert and cooperative, normal mood and affect.   Labs: CMP     Component Value Date/Time   NA 140 03/03/2016 0959   NA 140  01/12/2014 2035   K 3.5 03/03/2016 0959   K 3.9 01/12/2014 2035   CL 105 03/03/2016 0959   CL 104 01/12/2014 2035   CO2 28 03/03/2016 0959   CO2 30 01/12/2014 2035   GLUCOSE 105 (H) 03/03/2016 0959   GLUCOSE 108 (H) 01/12/2014 2035   BUN 11 03/03/2016 0959   BUN 16 01/12/2014 2035   CREATININE 0.60 11/30/2017 1231   CREATININE 0.59 (L) 01/12/2014 2035   CALCIUM 8.9 03/03/2016 0959   CALCIUM 8.4 (L) 01/12/2014 2035   GFRNONAA >60 03/03/2016 0959   GFRNONAA >60 01/12/2014 2035   GFRAA >60 03/03/2016 0959   GFRAA >60 01/12/2014 2035   Lab Results  Component Value Date   WBC 6.9 03/03/2016   HGB 13.4 03/03/2016   HCT 37.8 03/03/2016   MCV 81.2 03/03/2016   PLT 261 03/03/2016    Imaging Studies: No results found.  Assessment and Plan:   Kristi Conrad is a 68 y.o. y/o female here for follow-up of abdominal pain  abdominal pain completely resolved Patient using Metamucil as needed and reports good results  Patient had appendiceal inflammation seen on colonoscopy, but no appendicitis or abnormal changes of the appendix seen on follow-up CT scan.  Patient does not have any clinical symptoms of malignancy or appendicitis.  If patient develops any abdominal pain, weight loss, diarrhea, changes in bowel habits, fever chills, or any other reason for concern of abscess to call us or go to the ER  and she verbalized understanding.  Duodenal polyp seen during her EGD was an inflammatory polyp and thus does not need complete resection.  If an EGD is needed in the future for other reasons, this area can possibly reevaluated as well.  Patient will follow-up with us in 1 year and we will reassess symptoms at that time.  We can also discuss if a repeat colonoscopy is appropriate at that time to reassess appendiceal inflammation seen on last colonoscopy, although the CT and biopsies are reassuring.   Dr Kristi BouillonVarnita Valor Quaintance

## 2018-10-07 ENCOUNTER — Other Ambulatory Visit: Payer: Self-pay | Admitting: Primary Care

## 2018-10-07 DIAGNOSIS — Z1231 Encounter for screening mammogram for malignant neoplasm of breast: Secondary | ICD-10-CM

## 2018-10-07 DIAGNOSIS — Z0000 Encounter for general adult medical examination without abnormal findings: Secondary | ICD-10-CM

## 2018-11-15 ENCOUNTER — Ambulatory Visit
Admission: RE | Admit: 2018-11-15 | Discharge: 2018-11-15 | Disposition: A | Discharge status: Home / Self Care | Payer: Medicare Other | Source: Ambulatory Visit | Attending: Primary Care | Admitting: Primary Care

## 2018-11-15 DIAGNOSIS — Z0000 Encounter for general adult medical examination without abnormal findings: Secondary | ICD-10-CM | POA: Diagnosis present

## 2018-11-15 DIAGNOSIS — M8589 Other specified disorders of bone density and structure, multiple sites: Secondary | ICD-10-CM | POA: Diagnosis not present

## 2018-11-15 DIAGNOSIS — Z0001 Encounter for general adult medical examination with abnormal findings: Secondary | ICD-10-CM | POA: Insufficient documentation

## 2018-11-15 DIAGNOSIS — Z1231 Encounter for screening mammogram for malignant neoplasm of breast: Secondary | ICD-10-CM | POA: Insufficient documentation

## 2018-11-25 ENCOUNTER — Other Ambulatory Visit: Payer: Self-pay | Admitting: Primary Care

## 2018-11-25 DIAGNOSIS — R928 Other abnormal and inconclusive findings on diagnostic imaging of breast: Secondary | ICD-10-CM

## 2018-11-27 ENCOUNTER — Ambulatory Visit
Admission: RE | Admit: 2018-11-27 | Discharge: 2018-11-27 | Disposition: A | Discharge status: Home / Self Care | Payer: Medicare Other | Source: Ambulatory Visit | Attending: Primary Care | Admitting: Primary Care

## 2018-11-27 DIAGNOSIS — R928 Other abnormal and inconclusive findings on diagnostic imaging of breast: Secondary | ICD-10-CM

## 2019-03-10 ENCOUNTER — Telehealth: Payer: Self-pay

## 2019-03-10 NOTE — Telephone Encounter (Signed)
Called and left a message for call back. Used the interpreter service.

## 2019-03-10 NOTE — Telephone Encounter (Signed)
-----   Message from Pasty Spillers, MD sent at 03/10/2019  2:11 PM EST -----  Please set up clinic appointment with me next week to discuss repeat colonoscopy to follow-up on the abnormality seen on her previous colonoscopy and discuss any ongoing symptoms

## 2019-03-10 NOTE — Telephone Encounter (Signed)
-----   Message from Varnita B Tahiliani, MD sent at 03/10/2019  2:11 PM EST ----- ° Please set up clinic appointment with me next week to discuss repeat colonoscopy to follow-up on the abnormality seen on her previous colonoscopy and discuss any ongoing symptoms ° °

## 2019-03-10 NOTE — Telephone Encounter (Signed)
Scheduled patient on 03/20/2019

## 2019-03-10 NOTE — Telephone Encounter (Signed)
Left vm to offer apt  °

## 2019-03-20 ENCOUNTER — Encounter: Payer: Self-pay | Admitting: Gastroenterology

## 2019-03-20 ENCOUNTER — Other Ambulatory Visit: Payer: Self-pay

## 2019-03-20 ENCOUNTER — Ambulatory Visit: Payer: Medicare Other | Admitting: Gastroenterology

## 2019-03-20 ENCOUNTER — Ambulatory Visit (INDEPENDENT_AMBULATORY_CARE_PROVIDER_SITE_OTHER): Payer: Medicare Other | Admitting: Gastroenterology

## 2019-03-20 DIAGNOSIS — K317 Polyp of stomach and duodenum: Secondary | ICD-10-CM

## 2019-03-20 DIAGNOSIS — K529 Noninfective gastroenteritis and colitis, unspecified: Secondary | ICD-10-CM | POA: Diagnosis not present

## 2019-03-20 MED ORDER — NA SULFATE-K SULFATE-MG SULF 17.5-3.13-1.6 GM/177ML PO SOLN: 354.0000 mL | Freq: Once | ORAL | 0 refills | Status: AC

## 2019-03-20 NOTE — Progress Notes (Signed)
[name], MD [address]  [place], [place] [address]  Main: [phone]  Fax: [phone]   Primary Care Physician: Freddy Finner, NP  Virtual Visit via Telephone Note  I connected with patient on 03/20/19 at  9:30 AM EST by telephone and verified that I am speaking with the correct person using two identifiers.   I discussed the limitations, risks, security and privacy concerns of performing an evaluation and management service by telephone and the availability of in person appointments. I also discussed with the patient that there may be a patient responsible charge related to this service. The patient expressed understanding and agreed to proceed.  Location of Patient: Home Location of Provider: Home Persons involved: Patient, Spanish interpreter, and provider only during the visit (nursing staff and front desk staff was involved in communicating with the patient prior to the appointment, reviewing medications and checking them in)   History of Present Illness: Chief Complaint  Patient presents with  . Colonoscopy    to discuss repeat colonoscopy      HPI: Kristi Conrad is a 69 y.o. female here for follow-up of appendiceal inflammation noted on colonoscopy last year, and duodenal inflammatory polyp seen on upper endoscopy.  Patient was initially evaluated for abdominal pain which has completely resolved.  Good appetite.  Reports alternating constipation and diarrhea.  Will have 2 to 3 days in a week with constipation, and 2 to 3 days with 3-4 loose bowel movements a day.  No blood in stool.  Previous history: EGD September 2019.  Irregular Z line.  Gastric erythema.  Single duodenal polyp was incomplete resection.  Pathology from the polyp showed it to be an inflammatory polyp.  Stomach biopsies negative for H. pylori.  GE junction biopsy showed no intestinal metaplasia.  Colonoscopy September 2019.  Mild inflammation characterized by  erosions, erythema and shallow ulcerations sign at the appendiceal orifice.  Biopsies obtained.  Exam otherwise normal.  Colon biopsies showed focal active colitis and background reactive lymphoid aggregates.  Random colon biopsies did not show microscopic colitis.  Patient underwent a CT abdomen pelvis with contrast on the same day as a colonoscopy to rule out appendicitis given the appendiceal changes during the colonoscopy.  It did not show any radiographic evidence of appendicitis or any significant abnormality.  Patient was referred to [facility] surgery to discuss if appendectomy is appropriate given her findings on colonoscopy.  She was seen by Dr. Rosendo Gros who did not recommend appendectomy.   Current Outpatient Medications  Medication Sig Dispense Refill  . aspirin EC 81 MG tablet Take by mouth.    . DULoxetine (CYMBALTA) 30 MG capsule     . gabapentin (NEURONTIN) 300 MG capsule Take 1 capsule in the morning and 2 capsules at night.    Marland Kitchen ibuprofen (ADVIL,MOTRIN) 200 MG tablet Take 200 mg by mouth every 4 (four) hours as needed.    Marland Kitchen levothyroxine (SYNTHROID, LEVOTHROID) 50 MCG tablet     . losartan (COZAAR) 25 MG tablet Take by mouth.    . losartan (COZAAR) 50 MG tablet     . metoprolol succinate (TOPROL-XL) 25 MG 24 hr tablet Take 25 mg by mouth daily.  11  . naproxen (NAPROSYN) 500 MG tablet Take 1 tablet (500 mg total) by mouth 2 (two) times daily with a meal. 20 tablet 2   No current facility-administered medications for this visit.    Allergies as of 03/20/2019  . (No  Known Allergies)    Review of Systems:    All systems reviewed and negative except where noted in HPI.   Observations/Objective:  Labs: CMP     Component Value Date/Time   NA 140 03/03/2016 0959   NA 140 01/12/2014 2035   K 3.5 03/03/2016 0959   K 3.9 01/12/2014 2035   CL 105 03/03/2016 0959   CL 104 01/12/2014 2035   CO2 28 03/03/2016 0959   CO2 30 01/12/2014 2035   GLUCOSE 105 (H)  03/03/2016 0959   GLUCOSE 108 (H) 01/12/2014 2035   BUN 11 03/03/2016 0959   BUN 16 01/12/2014 2035   CREATININE 0.60 11/30/2017 1231   CREATININE 0.59 (L) 01/12/2014 2035   CALCIUM 8.9 03/03/2016 0959   CALCIUM 8.4 (L) 01/12/2014 2035   GFRNONAA >60 03/03/2016 0959   GFRNONAA >60 01/12/2014 2035   GFRAA >60 03/03/2016 0959   GFRAA >60 01/12/2014 2035   Lab Results  Component Value Date   WBC 6.9 03/03/2016   HGB 13.4 03/03/2016   HCT 37.8 03/03/2016   MCV 81.2 03/03/2016   PLT 261 03/03/2016    Imaging Studies: No results found.  Assessment and Plan:   Kristi Conrad is a 69 y.o. y/o female previously evaluated last year for abdominal pain which has completely resolved, now being seen for follow-up to discuss endoscopic procedures due to appendiceal inflammation noted on colonoscopy last year, and inflammatory duodenal polyp partially resected on last upper endoscopy  Assessment and Plan: Repeat colonoscopy would allow [place] to reevaluate the appendiceal orifice to ensure it appears normal.  CT scan last year was reassuring.  Obtain biopsies if still abnormal  EGD also indicated to reevaluate the duodenal polyp site.  Biopsies were consistent with an inflammatory polyp, however the biopsies were not representative of the entire polyp.  And it would be useful to reevaluate the duodenum to see if the polyp is still present and obtain more biopsies or remove it if present.  Patient is agreeable to the above procedures.  However, due to the limited endoscopy scheduled due to COVID-19 situation, she has been informed that she will be placed on a wait list as elective procedures are only being done on a very limited basis.  She verbalized understanding and will wait for a call to get scheduled    Follow Up Instructions: 3 to 4 months   I discussed the assessment and treatment plan with the patient. The patient was provided an opportunity to ask questions and all were  answered. The patient agreed with the plan and demonstrated an understanding of the instructions.   The patient was advised to call back or seek an in-person evaluation if the symptoms worsen or if the condition fails to improve as anticipated.  I provided 15 minutes of non-face-to-face time during this encounter. Additional time was spent in reviewing patient's chart, placing orders etc. Spanish interpreter used for entire conversation   Pasty Spillers, MD  Speech recognition software was used to dictate this note.

## 2019-03-20 NOTE — Addendum Note (Signed)
Addended by: Radene Knee L on: 03/20/2019 10:08 AM   Modules accepted: Orders

## 2019-04-03 ENCOUNTER — Other Ambulatory Visit: Payer: Self-pay

## 2019-04-03 ENCOUNTER — Encounter: Payer: Self-pay | Admitting: Gastroenterology

## 2019-04-10 ENCOUNTER — Other Ambulatory Visit
Admission: RE | Admit: 2019-04-10 | Discharge: 2019-04-10 | Disposition: A | Discharge status: Home / Self Care | Payer: Medicare Other | Source: Ambulatory Visit | Attending: Gastroenterology | Admitting: Gastroenterology

## 2019-04-10 DIAGNOSIS — Z01812 Encounter for preprocedural laboratory examination: Secondary | ICD-10-CM | POA: Diagnosis present

## 2019-04-10 DIAGNOSIS — Z20822 Contact with and (suspected) exposure to covid-19: Secondary | ICD-10-CM | POA: Diagnosis not present

## 2019-04-10 LAB — SARS CORONAVIRUS 2 (TAT 6-24 HRS): SARS Coronavirus 2: NEGATIVE

## 2019-04-11 NOTE — Discharge Instructions (Signed)
General Anesthesia, Adult, Care After This sheet gives you information about how to care for yourself after your procedure. Your health care provider may also give you more specific instructions. If you have problems or questions, contact your health care provider. What can I expect after the procedure? After the procedure, the following side effects are common:  Pain or discomfort at the IV site.  Nausea.  Vomiting.  Sore throat.  Trouble concentrating.  Feeling cold or chills.  Weak or tired.  Sleepiness and fatigue.  Soreness and body aches. These side effects can affect parts of the body that were not involved in surgery. Follow these instructions at home:  For at least 24 hours after the procedure:  Have a responsible adult stay with you. It is important to have someone help care for you until you are awake and alert.  Rest as needed.  Do not: ? Participate in activities in which you could fall or become injured. ? Drive. ? Use heavy machinery. ? Drink alcohol. ? Take sleeping pills or medicines that cause drowsiness. ? Make important decisions or sign legal documents. ? Take care of children on your own. Eating and drinking  Follow any instructions from your health care provider about eating or drinking restrictions.  When you feel hungry, start by eating small amounts of foods that are soft and easy to digest (bland), such as toast. Gradually return to your regular diet.  Drink enough fluid to keep your urine pale yellow.  If you vomit, rehydrate by drinking water, juice, or clear broth. General instructions  If you have sleep apnea, surgery and certain medicines can increase your risk for breathing problems. Follow instructions from your health care provider about wearing your sleep device: ? Anytime you are sleeping, including during daytime naps. ? While taking prescription pain medicines, sleeping medicines, or medicines that make you drowsy.  Return to  your normal activities as told by your health care provider. Ask your health care provider what activities are safe for you.  Take over-the-counter and prescription medicines only as told by your health care provider.  If you smoke, do not smoke without supervision.  Keep all follow-up visits as told by your health care provider. This is important. Contact a health care provider if:  You have nausea or vomiting that does not get better with medicine.  You cannot eat or drink without vomiting.  You have pain that does not get better with medicine.  You are unable to pass urine.  You develop a skin rash.  You have a fever.  You have redness around your IV site that gets worse. Get help right away if:  You have difficulty breathing.  You have chest pain.  You have blood in your urine or stool, or you vomit blood. Summary  After the procedure, it is common to have a sore throat or nausea. It is also common to feel tired.  Have a responsible adult stay with you for the first 24 hours after general anesthesia. It is important to have someone help care for you until you are awake and alert.  When you feel hungry, start by eating small amounts of foods that are soft and easy to digest (bland), such as toast. Gradually return to your regular diet.  Drink enough fluid to keep your urine pale yellow.  Return to your normal activities as told by your health care provider. Ask your health care provider what activities are safe for you. This information is not   intended to replace advice given to you by your health care provider. Make sure you discuss any questions you have with your health care provider. Document Revised: 02/23/2017 Document Reviewed: 10/06/2016 Elsevier Patient Education  2020 Elsevier Inc.  

## 2019-04-14 ENCOUNTER — Ambulatory Visit: Payer: Medicare Other | Admitting: Anesthesiology

## 2019-04-14 ENCOUNTER — Ambulatory Visit
Admission: RE | Admit: 2019-04-14 | Discharge: 2019-04-14 | Disposition: A | Discharge status: Home / Self Care | Payer: Medicare Other | Attending: Gastroenterology | Admitting: Gastroenterology

## 2019-04-14 ENCOUNTER — Encounter
Admission: RE | Disposition: A | Discharge status: Home / Self Care | Payer: Self-pay | Source: Home / Self Care | Attending: Gastroenterology

## 2019-04-14 ENCOUNTER — Other Ambulatory Visit: Payer: Self-pay

## 2019-04-14 ENCOUNTER — Other Ambulatory Visit: Payer: Self-pay | Admitting: Gastroenterology

## 2019-04-14 ENCOUNTER — Encounter: Payer: Self-pay | Admitting: Gastroenterology

## 2019-04-14 DIAGNOSIS — Z7982 Long term (current) use of aspirin: Secondary | ICD-10-CM | POA: Insufficient documentation

## 2019-04-14 DIAGNOSIS — K529 Noninfective gastroenteritis and colitis, unspecified: Secondary | ICD-10-CM

## 2019-04-14 DIAGNOSIS — K317 Polyp of stomach and duodenum: Secondary | ICD-10-CM

## 2019-04-14 DIAGNOSIS — Z79890 Hormone replacement therapy (postmenopausal): Secondary | ICD-10-CM | POA: Diagnosis not present

## 2019-04-14 DIAGNOSIS — I10 Essential (primary) hypertension: Secondary | ICD-10-CM | POA: Diagnosis not present

## 2019-04-14 DIAGNOSIS — K259 Gastric ulcer, unspecified as acute or chronic, without hemorrhage or perforation: Secondary | ICD-10-CM

## 2019-04-14 DIAGNOSIS — Z09 Encounter for follow-up examination after completed treatment for conditions other than malignant neoplasm: Secondary | ICD-10-CM | POA: Diagnosis present

## 2019-04-14 DIAGNOSIS — K319 Disease of stomach and duodenum, unspecified: Secondary | ICD-10-CM | POA: Insufficient documentation

## 2019-04-14 DIAGNOSIS — Z8719 Personal history of other diseases of the digestive system: Secondary | ICD-10-CM | POA: Diagnosis not present

## 2019-04-14 DIAGNOSIS — Z79899 Other long term (current) drug therapy: Secondary | ICD-10-CM | POA: Insufficient documentation

## 2019-04-14 DIAGNOSIS — E039 Hypothyroidism, unspecified: Secondary | ICD-10-CM | POA: Insufficient documentation

## 2019-04-14 HISTORY — PX: COLONOSCOPY WITH PROPOFOL: SHX5780

## 2019-04-14 HISTORY — PX: ESOPHAGOGASTRODUODENOSCOPY (EGD) WITH PROPOFOL: SHX5813

## 2019-04-14 SURGERY — COLONOSCOPY WITH PROPOFOL
Anesthesia: General | Site: Throat

## 2019-04-14 MED ORDER — OMEPRAZOLE 20 MG PO CPDR: 20.0000 mg | DELAYED_RELEASE_CAPSULE | Freq: Two times a day (BID) | ORAL | 1 refills | Status: DC

## 2019-04-14 MED ORDER — GLYCOPYRROLATE 0.2 MG/ML IJ SOLN
INTRAMUSCULAR | Status: DC | PRN
  Administered 2019-04-14: .1 mg via INTRAVENOUS

## 2019-04-14 MED ORDER — PROPOFOL 10 MG/ML IV BOLUS
INTRAVENOUS | Status: DC | PRN
  Administered 2019-04-14 (×3): 20 mg via INTRAVENOUS
  Administered 2019-04-14: 30 mg via INTRAVENOUS
  Administered 2019-04-14 (×3): 20 mg via INTRAVENOUS
  Administered 2019-04-14: 80 mg via INTRAVENOUS
  Administered 2019-04-14 (×2): 40 mg via INTRAVENOUS

## 2019-04-14 MED ORDER — LACTATED RINGERS IV SOLN
100.0000 mL/h | INTRAVENOUS | Status: DC
  Administered 2019-04-14: 100 mL/h via INTRAVENOUS

## 2019-04-14 MED ORDER — LIDOCAINE HCL (CARDIAC) PF 100 MG/5ML IV SOSY
PREFILLED_SYRINGE | INTRAVENOUS | Status: DC | PRN
  Administered 2019-04-14: 30 mg via INTRAVENOUS

## 2019-04-14 MED ORDER — STERILE WATER FOR IRRIGATION IR SOLN
Status: DC | PRN
  Administered 2019-04-14: .05 mL

## 2019-04-14 MED ORDER — ONDANSETRON HCL 4 MG/2ML IJ SOLN: 4.0000 mg | Freq: Once | INTRAMUSCULAR | Status: DC | PRN

## 2019-04-14 SURGICAL SUPPLY — 23 items
BLOCK BITE 60FR ADLT L/F GRN (MISCELLANEOUS) ×4 IMPLANT
CANISTER SUCT 1200ML W/VALVE (MISCELLANEOUS) ×4 IMPLANT
ELECT REM PT RETURN 9FT ADLT (ELECTROSURGICAL)
ELECTRODE REM PT RTRN 9FT ADLT (ELECTROSURGICAL) IMPLANT
GOWN CVR UNV OPN BCK APRN NK (MISCELLANEOUS) ×4 IMPLANT
GOWN ISOL THUMB LOOP REG UNIV (MISCELLANEOUS) ×4
INJECTOR VARIJECT VIN23 (MISCELLANEOUS) IMPLANT
KIT DEFENDO VALVE AND CONN (KITS) IMPLANT
KIT ENDO PROCEDURE OLY (KITS) ×4 IMPLANT
MARKER SPOT ENDO TATTOO 5ML (MISCELLANEOUS) IMPLANT
PROBE APC STR FIRE (PROBE) IMPLANT
RETRIEVER NET PLAT FOOD (MISCELLANEOUS) IMPLANT
RETRIEVER NET ROTH 2.5X230 LF (MISCELLANEOUS) IMPLANT
SNARE COLD EXACTO (MISCELLANEOUS) IMPLANT
SNARE SHORT THROW 13M SML OVAL (MISCELLANEOUS) IMPLANT
SNARE SHORT THROW 30M LRG OVAL (MISCELLANEOUS) IMPLANT
SNARE SNG USE RND 15MM (INSTRUMENTS) IMPLANT
SPOT EX ENDOSCOPIC TATTOO (MISCELLANEOUS)
SYR INFLATION 60ML (SYRINGE) IMPLANT
TRAP ETRAP POLY (MISCELLANEOUS) IMPLANT
VARIJECT INJECTOR VIN23 (MISCELLANEOUS)
WATER STERILE IRR 250ML POUR (IV SOLUTION) ×4 IMPLANT
WIRE CRE 18-20MM 8CM F G (MISCELLANEOUS) IMPLANT

## 2019-04-14 NOTE — Anesthesia Procedure Notes (Signed)
Date/Time: 04/14/2019 10:16 AM Performed by: Maree Krabbe, CRNA Pre-anesthesia Checklist: Patient identified, Emergency Drugs available, Suction available, Timeout performed and Patient being monitored Patient Re-evaluated:Patient Re-evaluated prior to induction Oxygen Delivery Method: Nasal cannula Placement Confirmation: positive ETCO2

## 2019-04-14 NOTE — Anesthesia Preprocedure Evaluation (Signed)
Anesthesia Evaluation  Patient identified by MRN, date of birth, ID band Patient awake    Reviewed: Allergy & Precautions, H&P , NPO status , Patient's Chart, lab work & pertinent test results, reviewed documented beta blocker date and time   Airway Mallampati: II  TM Distance: >3 FB Neck ROM: full    Dental no notable dental hx.    Pulmonary neg pulmonary ROS,    Pulmonary exam normal breath sounds clear to auscultation       Cardiovascular Exercise Tolerance: Good hypertension,  Rhythm:regular Rate:Normal     Neuro/Psych negative neurological ROS  negative psych ROS   GI/Hepatic negative GI ROS, Neg liver ROS,   Endo/Other  Hypothyroidism   Renal/GU negative Renal ROS  negative genitourinary   Musculoskeletal   Abdominal   Peds  Hematology negative hematology ROS (+)   Anesthesia Other Findings   Reproductive/Obstetrics negative OB ROS                             Anesthesia Physical Anesthesia Plan  ASA: II  Anesthesia Plan: General   Post-op Pain Management:    Induction:   PONV Risk Score and Plan: 3 and Propofol infusion, TIVA and Treatment may vary due to age or medical condition  Airway Management Planned:   Additional Equipment:   Intra-op Plan:   Post-operative Plan:   Informed Consent: I have reviewed the patients History and Physical, chart, labs and discussed the procedure including the risks, benefits and alternatives for the proposed anesthesia with the patient or authorized representative who has indicated his/her understanding and acceptance.     Dental Advisory Given  Plan Discussed with: CRNA  Anesthesia Plan Comments:         Anesthesia Quick Evaluation

## 2019-04-14 NOTE — H&P (Signed)
Kristi Repress, MD [address]  [address]  [place], [place] [address]  Main: [phone]  Fax: [phone] Pager: [phone]  Primary Care Physician:  Sandrea Hughs, NP Primary Gastroenterologist:  Dr. Arlyss Conrad  Pre-Procedure History & Physical: HPI:  Kristi Conrad is a 69 y.o. female is here for an endoscopy and colonoscopy.   Past Medical History:  Diagnosis Date  . Hypertension   . Thyroid disease     Past Surgical History:  Procedure Laterality Date  . ABDOMINAL HYSTERECTOMY    . CARDIAC SURGERY    . COLONOSCOPY WITH PROPOFOL N/A 11/30/2017   Procedure: COLONOSCOPY WITH PROPOFOL;  Surgeon: Pasty Spillers, MD;  Location: [facility] ENDOSCOPY;  Service: Endoscopy;  Laterality: N/A;  . ESOPHAGOGASTRODUODENOSCOPY (EGD) WITH PROPOFOL N/A 11/30/2017   Procedure: ESOPHAGOGASTRODUODENOSCOPY (EGD) WITH PROPOFOL;  Surgeon: Pasty Spillers, MD;  Location: [facility] ENDOSCOPY;  Service: Endoscopy;  Laterality: N/A;    Prior to Admission medications   Medication Sig Start Date End Date Taking? Authorizing Provider  aspirin EC 81 MG tablet Take by mouth.    [provider]  DULoxetine (CYMBALTA) 30 MG capsule  07/31/17   [provider]  gabapentin (NEURONTIN) 300 MG capsule Take 1 capsule in the morning and 2 capsules at night. 02/05/17   [provider]  ibuprofen (ADVIL,MOTRIN) 200 MG tablet Take 200 mg by mouth every 4 (four) hours as needed.    [provider]  levothyroxine (SYNTHROID, LEVOTHROID) 50 MCG tablet  08/21/17   [provider]  losartan (COZAAR) 25 MG tablet Take by mouth. 11/22/17   [provider]  losartan (COZAAR) 50 MG tablet  07/16/17   [provider]  metoprolol succinate (TOPROL-XL) 25 MG 24 hr tablet Take 25 mg by mouth daily. 08/03/17   [provider]  naproxen (NAPROSYN) 500 MG tablet Take 1 tablet (500 mg total) by mouth 2 (two) times daily with a meal. Patient  not taking: Reported on 04/03/2019 03/03/16   Jene Every, MD    Allergies as of 03/20/2019  . (No Known Allergies)    History reviewed. No pertinent family history.  Social History   Socioeconomic History  . Marital status: Married    Spouse name: Not on file  . Number of children: Not on file  . Years of education: Not on file  . Highest education level: Not on file  Occupational History  . Not on file  Tobacco Use  . Smoking status: Never Smoker  . Smokeless tobacco: Never Used  Substance and Sexual Activity  . Alcohol use: Yes  . Drug use: Not on file  . Sexual activity: Not on file  Other Topics Concern  . Not on file  Social History Narrative  . Not on file   Social Determinants of Health   Financial Resource Strain:   . Difficulty of Paying Living Expenses: Not on file  Food Insecurity:   . Worried About Programme researcher, broadcasting/film/video in the Last Year: Not on file  . Ran Out of Food in the Last Year: Not on file  Transportation Needs:   . Lack of Transportation (Medical): Not on file  . Lack of Transportation (Non-Medical): Not on file  Physical Activity:   . Days of Exercise per Week: Not on file  . Minutes of Exercise per Session: Not on file  Stress:   . Feeling of Stress : Not on file  Social Connections:   . Frequency of  Communication with Friends and Family: Not on file  . Frequency of Social Gatherings with Friends and Family: Not on file  . Attends Religious Services: Not on file  . Active Member of Clubs or Organizations: Not on file  . Attends Archivist Meetings: Not on file  . Marital Status: Not on file  Intimate Partner Violence:   . Fear of Current or Ex-Partner: Not on file  . Emotionally Abused: Not on file  . Physically Abused: Not on file  . Sexually Abused: Not on file    Review of Systems: See HPI, otherwise negative ROS  Physical Exam: BP (!) 147/80   Pulse 67   Temp (!) 97.5 F (36.4 C) (Temporal)   Resp 16   Ht 5\' 2"   (1.575 m)   Wt 69.9 kg   SpO2 97%   BMI 28.17 kg/m  General:   Alert,  pleasant and cooperative in NAD Head:  Normocephalic and atraumatic. Neck:  Supple; no masses or thyromegaly. Lungs:  Clear throughout to auscultation.    Heart:  Regular rate and rhythm. Abdomen:  Soft, nontender and nondistended. Normal bowel sounds, without guarding, and without rebound.   Neurologic:  Alert and  oriented x4;  grossly normal neurologically.  Impression/Plan: [name] is here for an endoscopy and colonoscopy to be performed for h/o duodenal polyp, abnormal appendiceal orifice  Risks, benefits, limitations, and alternatives regarding  endoscopy and colonoscopy have been reviewed with the patient.  Questions have been answered.  All parties agreeable.   Sherri Sear, MD  04/14/2019, 10:22 AM

## 2019-04-14 NOTE — Op Note (Signed)
[facility] Patient Name: Ivannia Willhelm [facility] Procedure Date: 04/14/2019 10:15 AM MRN: [id number] Account #: [id number] Date of Birth: [birth year] Admit Type: Outpatient Age: 69 Room: [facility] OR ROOM 01 Gender: Female Note Status: Finalized Procedure:             Colonoscopy Indications:           follow up of abnormal appendiceal orifice, acute                         colitis Providers:             Lin Landsman MD, MD Medicines:             Monitored Anesthesia Care Complications:         No immediate complications. Estimated blood loss: None. Procedure:             Pre-Anesthesia Assessment:                        - Prior to the procedure, a History and Physical was                         performed, and patient medications and allergies were                         reviewed. The patient is competent. The risks and                         benefits of the procedure and the sedation options and                         risks were discussed with the patient. All questions                         were answered and informed consent was obtained.                         Patient identification and proposed procedure were                         verified by the physician, the nurse, the                         anesthesiologist, the anesthetist and the technician                         in the pre-procedure area in the procedure room in the                         endoscopy suite. Mental Status Examination: alert and                         oriented. Airway Examination: normal oropharyngeal                         airway and neck mobility. Respiratory Examination:                         clear to auscultation. CV Examination: normal.  Prophylactic Antibiotics: The patient does not require                         prophylactic antibiotics. Prior Anticoagulants: The                         patient has taken no previous anticoagulant  or                         antiplatelet agents. ASA Grade Assessment: II - A                         patient with mild systemic disease. After reviewing                         the risks and benefits, the patient was deemed in                         satisfactory condition to undergo the procedure. The                         anesthesia plan was to use monitored anesthesia care                         (MAC). Immediately prior to administration of                         medications, the patient was re-assessed for adequacy                         to receive sedatives. The heart rate, respiratory                         rate, oxygen saturations, blood pressure, adequacy of                         pulmonary ventilation, and response to care were                         monitored throughout the procedure. The physical                         status of the patient was re-assessed after the                         procedure.                        After obtaining informed consent, the colonoscope was                         passed under direct vision. Throughout the procedure,                         the patient's blood pressure, pulse, and oxygen                         saturations were monitored continuously. The was  introduced through the anus and advanced to the the                         terminal ileum, with identification of the appendiceal                         orifice and IC valve. The colonoscopy was performed                         without difficulty. The patient tolerated the                         procedure well. The quality of the bowel preparation                         was evaluated using the BBPS [facility] Bowel Preparation                         Scale) with scores of: Right Colon = 3, Transverse                         Colon = 3 and Left Colon = 3 (entire mucosa seen well                         with no residual staining, small fragments of stool or                          opaque liquid). The total BBPS score equals 9. Findings:      The perianal and digital rectal examinations were normal. Pertinent       negatives include normal sphincter tone and no palpable rectal lesions.      The terminal ileum appeared normal.      The entire examined colon appeared normal. Normal AO and periappendiceal       mucosa      The retroflexed view of the distal rectum and anal verge was normal and       showed no anal or rectal abnormalities. Estimated blood loss: none. Impression:            - The examined portion of the ileum was normal.                        - The entire examined colon is normal.                        - The distal rectum and anal verge are normal on                         retroflexion view.                        - No specimens collected. Recommendation:        - Discharge patient to home (with escort).                        - Resume previous diet today.                        -  Continue present medications.                        - Repeat colonoscopy in 10 years for surveillance. Procedure Code(s):     --- Professional ---                        [phone], Colonoscopy, flexible; diagnostic, including                         collection of specimen(s) by brushing or washing, when                         performed (separate procedure) CPT copyright 2019 American Medical Association. All rights reserved. The codes documented in this report are preliminary and upon coder review may  be revised to meet current compliance requirements. Dr. Ulyess Mort Lin Landsman MD, MD 04/14/2019 10:50:12 AM This report has been signed electronically. Number of Addenda: 0 Note Initiated On: 04/14/2019 10:15 AM Scope Withdrawal Time: 0 hours 5 minutes 20 seconds  Total Procedure Duration: 0 hours 7 minutes 58 seconds  Estimated Blood Loss:  Estimated blood loss: none.      [facility]

## 2019-04-14 NOTE — Transfer of Care (Signed)
Immediate Anesthesia Transfer of Care Note  Patient: Kristi Conrad [place]  Procedure(s) Performed: COLONOSCOPY WITH PROPOFOL (N/A Rectum) ESOPHAGOGASTRODUODENOSCOPY (EGD) WITH PROPOFOL (N/A Throat)  Patient Location: PACU  Anesthesia Type: General  Level of Consciousness: awake, alert  and patient cooperative  Airway and Oxygen Therapy: Patient Spontanous Breathing and Patient connected to supplemental oxygen  Post-op Assessment: Post-op Vital signs reviewed, Patient's Cardiovascular Status Stable, Respiratory Function Stable, Patent Airway and No signs of Nausea or vomiting  Post-op Vital Signs: Reviewed and stable  Complications: No apparent anesthesia complications

## 2019-04-14 NOTE — Anesthesia Postprocedure Evaluation (Signed)
Anesthesia Post Note  Patient: Kristi Conrad  Procedure(s) Performed: COLONOSCOPY WITH PROPOFOL (N/A Rectum) ESOPHAGOGASTRODUODENOSCOPY (EGD) WITH PROPOFOL (N/A Throat)     Patient location during evaluation: PACU Anesthesia Type: General Level of consciousness: awake and alert Pain management: pain level controlled Vital Signs Assessment: post-procedure vital signs reviewed and stable Respiratory status: spontaneous breathing, nonlabored ventilation, respiratory function stable and patient connected to nasal cannula oxygen Cardiovascular status: blood pressure returned to baseline and stable Postop Assessment: no apparent nausea or vomiting Anesthetic complications: no    Scarlette Slice

## 2019-04-14 NOTE — Op Note (Signed)
[facility] Gastroenterology Patient Name: Kristi Conrad [facility] Procedure Date: 04/14/2019 10:15 AM MRN: [id number] Account #: [id number] Date of Birth: [birth year] Admit Type: Outpatient Age: 69 Room: [facility] OR ROOM 01 Gender: Female Note Status: Finalized Procedure:             Upper GI endoscopy Indications:           Follow-up of polyp in the duodenum Providers:             Lin Landsman MD, MD Referring MD:          Neomia Dear (Referring MD) Medicines:             Monitored Anesthesia Care Complications:         No immediate complications. Estimated blood loss: None. Procedure:             Pre-Anesthesia Assessment:                        - Prior to the procedure, a History and Physical was                         performed, and patient medications and allergies were                         reviewed. The patient is competent. The risks and                         benefits of the procedure and the sedation options and                         risks were discussed with the patient. All questions                         were answered and informed consent was obtained.                         Patient identification and proposed procedure were                         verified by the physician, the nurse, the                         anesthesiologist, the anesthetist and the technician                         in the pre-procedure area in the procedure room in the                         endoscopy suite. Mental Status Examination: alert and                         oriented. Airway Examination: normal oropharyngeal                         airway and neck mobility. Respiratory Examination:                         clear to auscultation. CV Examination: normal.  Prophylactic Antibiotics: The patient does not require                         prophylactic antibiotics. Prior Anticoagulants: The                         patient has taken no previous  anticoagulant or                         antiplatelet agents. ASA Grade Assessment: II - A                         patient with mild systemic disease. After reviewing                         the risks and benefits, the patient was deemed in                         satisfactory condition to undergo the procedure. The                         anesthesia plan was to use monitored anesthesia care                         (MAC). Immediately prior to administration of                         medications, the patient was re-assessed for adequacy                         to receive sedatives. The heart rate, respiratory                         rate, oxygen saturations, blood pressure, adequacy of                         pulmonary ventilation, and response to care were                         monitored throughout the procedure. The physical                         status of the patient was re-assessed after the                         procedure.                        After obtaining informed consent, the endoscope was                         passed under direct vision. Throughout the procedure,                         the patient's blood pressure, pulse, and oxygen                         saturations were monitored continuously. The  Endosonoscope was introduced through the mouth, and                         advanced to the second part of duodenum. The upper GI                         endoscopy was accomplished without difficulty. The                         patient tolerated the procedure well. Findings:      The duodenal bulb and second portion of the duodenum were normal.       Previously seen polyp is not present on today's exam      Multiple dispersed diminutive erosions with no bleeding and no stigmata       of recent bleeding were found in the gastric antrum and in the       prepyloric region of the stomach.      The gastroesophageal junction and examined esophagus were  normal. Impression:            - Normal duodenal bulb and second portion of the                         duodenum.                        - Erosive gastropathy with no bleeding and no stigmata                         of recent bleeding.                        - Normal gastroesophageal junction and esophagus.                        - No specimens collected. Recommendation:        - Proceed with colonoscopy as scheduled                        See colonoscopy report                        - Use Prilosec (omeprazole) 20 mg PO BID for 2 months.                        - Perform an H. pylori serology.                        - No ibuprofen, naproxen, or other non-steroidal                         anti-inflammatory drugs. Procedure Code(s):     --- Professional ---                        [phone], Esophagogastroduodenoscopy, flexible,                         transoral; diagnostic, including collection of                         specimen(s) by  brushing or washing, when performed                         (separate procedure) Diagnosis Code(s):     --- Professional ---                        K31.89, Other diseases of stomach and duodenum                        K31.7, Polyp of stomach and duodenum CPT copyright 2019 [employer]. All rights reserved. The codes documented in this report are preliminary and upon coder review may  be revised to meet current compliance requirements. Dr. Ulyess Mort Lin Landsman MD, MD 04/14/2019 10:37:28 AM This report has been signed electronically. Number of Addenda: 0 Note Initiated On: 04/14/2019 10:15 AM Total Procedure Duration: 0 hours 5 minutes 34 seconds  Estimated Blood Loss:  Estimated blood loss: none.      [facility]

## 2019-10-17 ENCOUNTER — Other Ambulatory Visit: Payer: Self-pay | Admitting: Primary Care

## 2019-10-17 DIAGNOSIS — Z1231 Encounter for screening mammogram for malignant neoplasm of breast: Secondary | ICD-10-CM

## 2019-10-22 ENCOUNTER — Ambulatory Visit: Payer: Medicare Other | Admitting: Dermatology

## 2019-11-18 ENCOUNTER — Ambulatory Visit
Admission: RE | Admit: 2019-11-18 | Discharge: 2019-11-18 | Disposition: A | Discharge status: Home / Self Care | Payer: Medicare Other | Source: Ambulatory Visit | Attending: Primary Care | Admitting: Primary Care

## 2019-11-18 ENCOUNTER — Other Ambulatory Visit: Payer: Self-pay

## 2019-11-18 DIAGNOSIS — Z1231 Encounter for screening mammogram for malignant neoplasm of breast: Secondary | ICD-10-CM

## 2020-01-08 ENCOUNTER — Other Ambulatory Visit: Payer: Self-pay | Admitting: Otolaryngology

## 2020-01-13 LAB — SURGICAL PATHOLOGY

## 2020-03-30 ENCOUNTER — Other Ambulatory Visit: Payer: Self-pay | Admitting: Orthopedic Surgery

## 2020-03-30 DIAGNOSIS — M2392 Unspecified internal derangement of left knee: Secondary | ICD-10-CM

## 2020-11-17 ENCOUNTER — Other Ambulatory Visit: Payer: Self-pay | Admitting: Primary Care

## 2020-11-17 DIAGNOSIS — Z1231 Encounter for screening mammogram for malignant neoplasm of breast: Secondary | ICD-10-CM

## 2020-11-17 DIAGNOSIS — Z0000 Encounter for general adult medical examination without abnormal findings: Secondary | ICD-10-CM

## 2021-01-18 ENCOUNTER — Other Ambulatory Visit: Payer: Self-pay

## 2021-01-18 ENCOUNTER — Ambulatory Visit
Admission: RE | Admit: 2021-01-18 | Discharge: 2021-01-18 | Disposition: A | Discharge status: Home / Self Care | Payer: Medicare Other | Source: Ambulatory Visit | Attending: Primary Care | Admitting: Primary Care

## 2021-01-18 DIAGNOSIS — Z1231 Encounter for screening mammogram for malignant neoplasm of breast: Secondary | ICD-10-CM | POA: Diagnosis not present

## 2021-02-03 DIAGNOSIS — I4891 Unspecified atrial fibrillation: Secondary | ICD-10-CM

## 2021-02-03 HISTORY — DX: Unspecified atrial fibrillation: I48.91

## 2021-02-09 IMAGING — MG MM DIGITAL DIAGNOSTIC UNILAT*R* W/ TOMO W/ CAD
6 series · 6 of 18 positions shown · non-contrast
Comparison: Previous exam(s).

CLINICAL DATA: Screening recall for a possible right breast
distortion.

EXAM:
DIGITAL DIAGNOSTIC UNILATERAL RIGHT MAMMOGRAM WITH CAD AND TOMO

[R LM synth-2D]
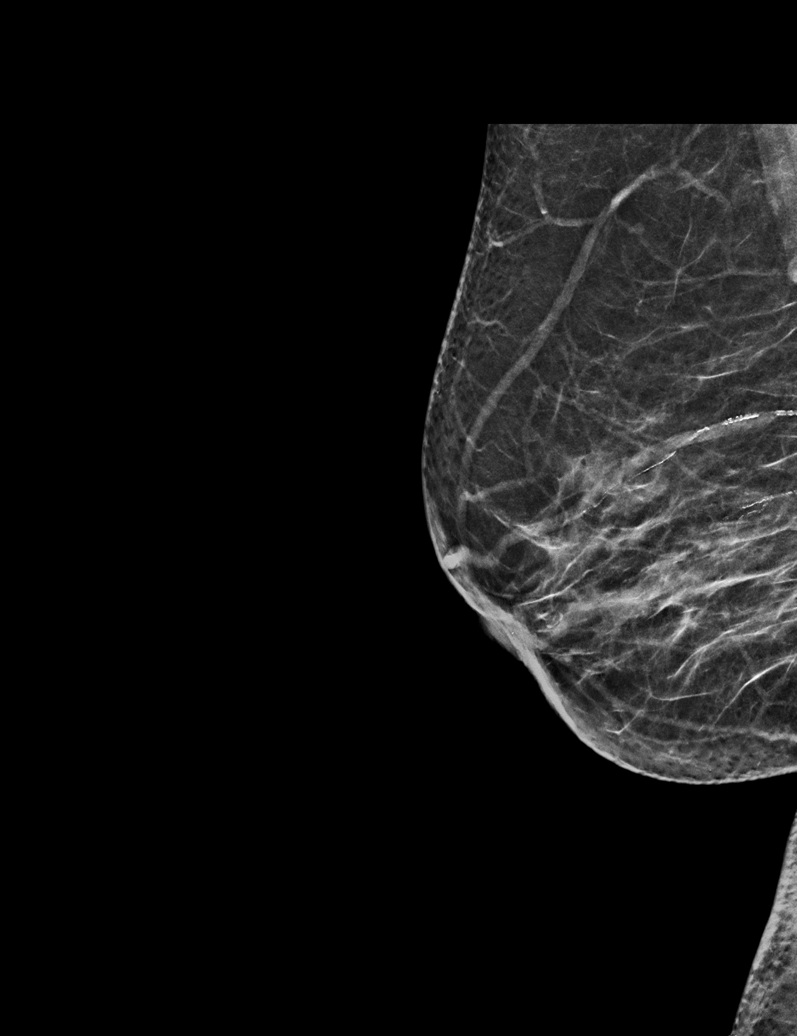

[R ML synth-2D]
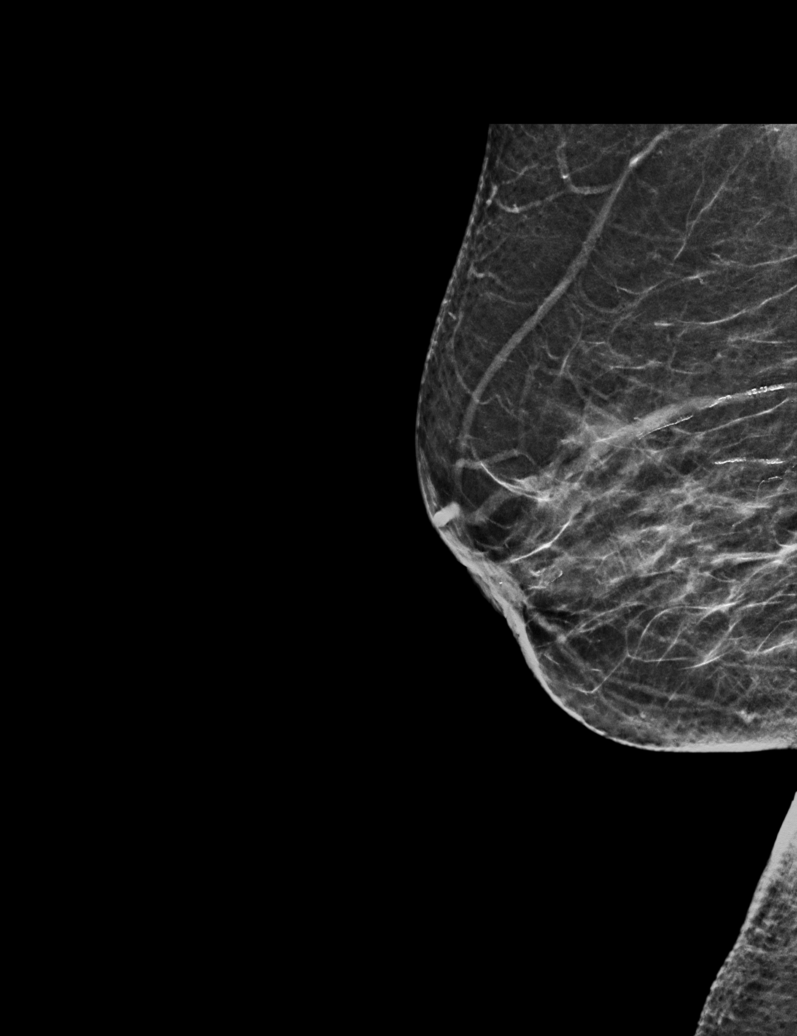

[R CC synth-2D]
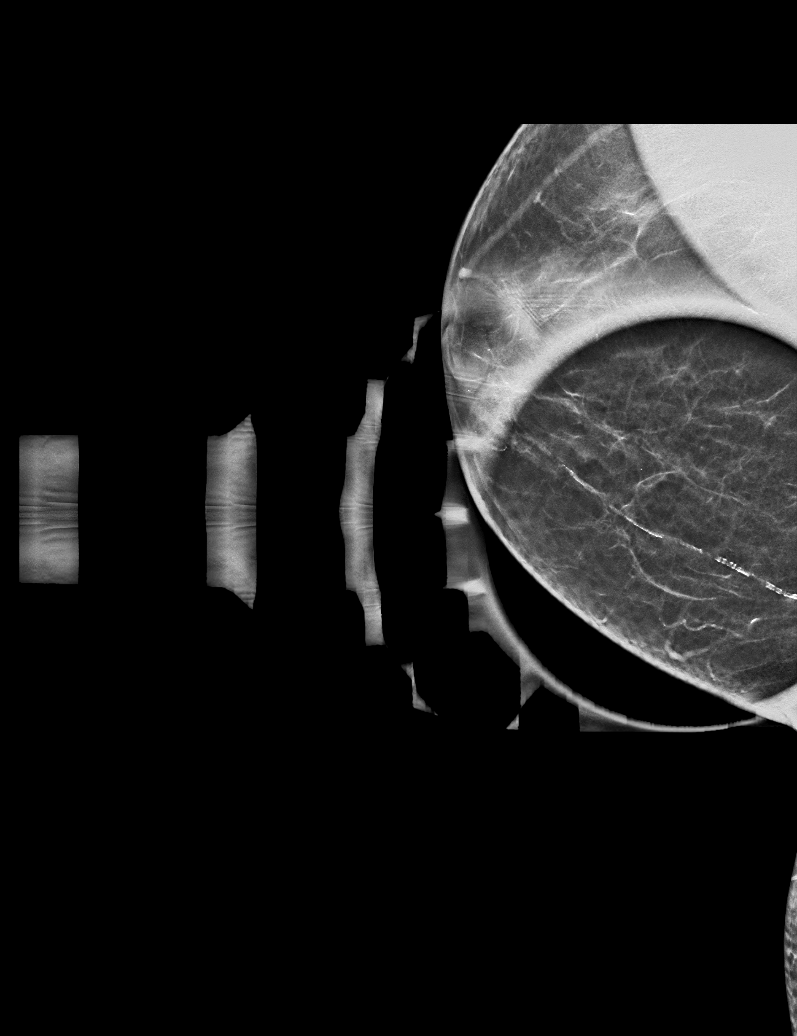

[R ML tomo · tomo slice 29/56.0]
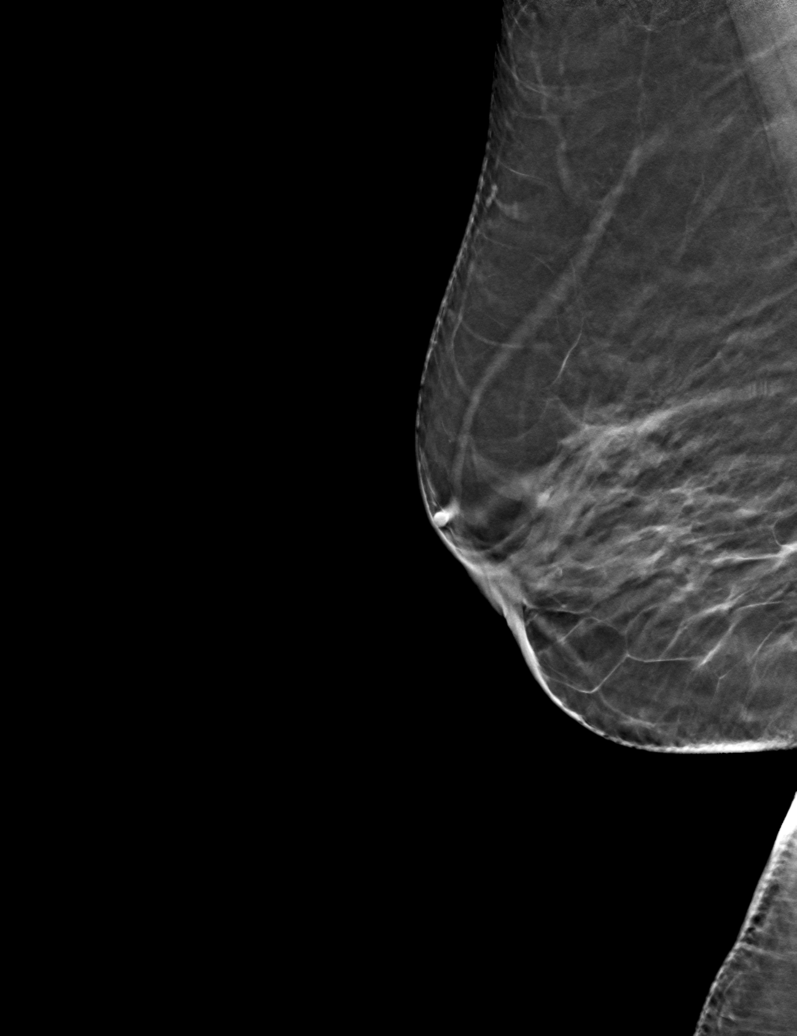

[R LM tomo · tomo slice 27/54.0]
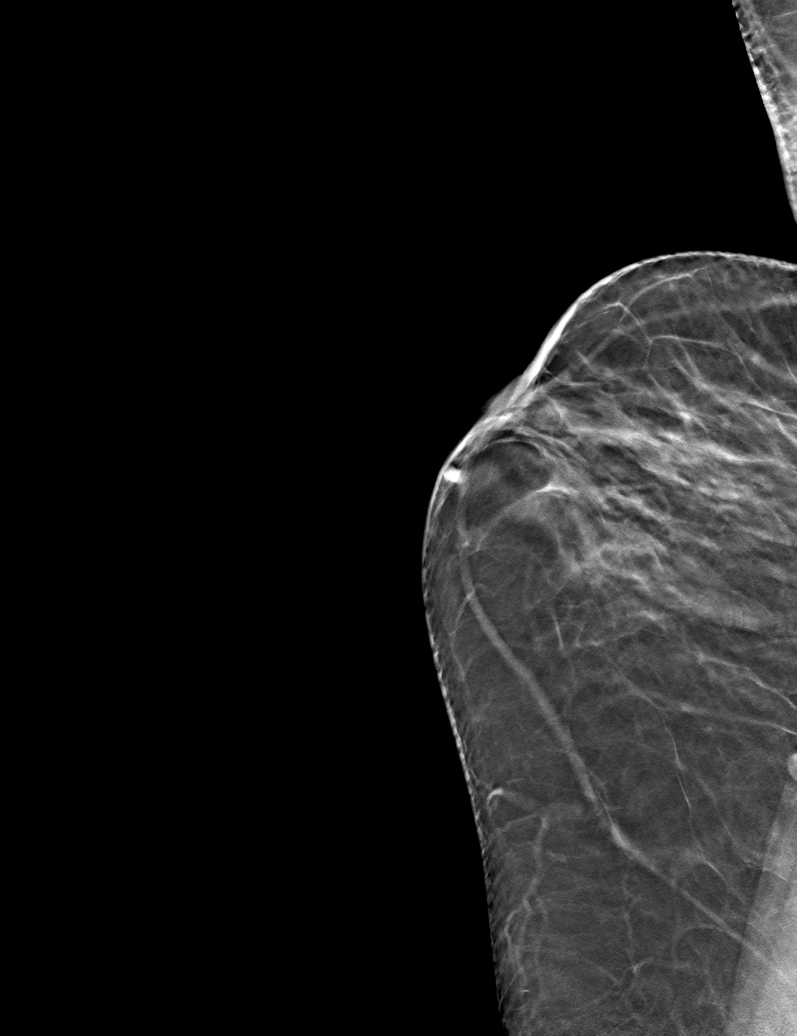

[R CC tomo · tomo slice 22/43.0]
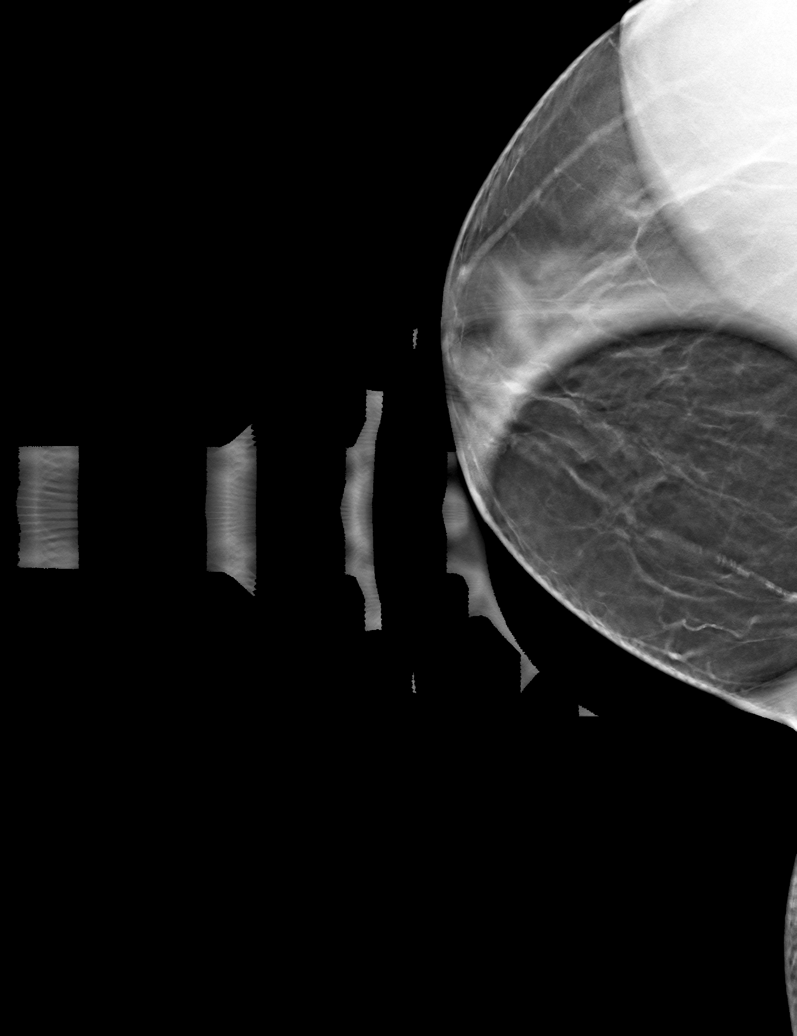

[6 of 18 positions shown; findings below may reference images not displayed]

ACR Breast Density Category c: The breast tissue is heterogeneously
dense, which may obscure small masses.
FINDINGS: The questioned distortion in the medial right breast resolves on
spot compression tomosynthesis imaging consistent with overlapping
fibroglandular tissue. No suspicious calcifications, masses or areas
of distortion are seen in the right breast.

Mammographic images were processed with CAD.
IMPRESSION: Resolution of the right breast questioned distortion consistent with
overlapping fibroglandular tissue.

RECOMMENDATION:
Screening mammogram in one year.(Code:[id number])

I have discussed the findings and recommendations with the patient.
If applicable, a reminder letter will be sent to the patient
regarding the next appointment.

BI-RADS CATEGORY  1: Negative.

## 2021-02-18 ENCOUNTER — Other Ambulatory Visit: Payer: Self-pay

## 2021-02-18 ENCOUNTER — Emergency Department
Admission: EM | Admit: 2021-02-18 | Discharge: 2021-02-18 | Disposition: A | Discharge status: Home / Self Care | Payer: Medicare Other | Attending: Emergency Medicine | Admitting: Emergency Medicine

## 2021-02-18 ENCOUNTER — Emergency Department: Payer: Medicare Other

## 2021-02-18 ENCOUNTER — Encounter: Payer: Self-pay | Admitting: Emergency Medicine

## 2021-02-18 ENCOUNTER — Other Ambulatory Visit: Payer: Self-pay | Admitting: Primary Care

## 2021-02-18 DIAGNOSIS — Z79899 Other long term (current) drug therapy: Secondary | ICD-10-CM | POA: Insufficient documentation

## 2021-02-18 DIAGNOSIS — I4891 Unspecified atrial fibrillation: Secondary | ICD-10-CM | POA: Insufficient documentation

## 2021-02-18 DIAGNOSIS — R1030 Lower abdominal pain, unspecified: Secondary | ICD-10-CM | POA: Diagnosis present

## 2021-02-18 DIAGNOSIS — E039 Hypothyroidism, unspecified: Secondary | ICD-10-CM | POA: Diagnosis not present

## 2021-02-18 DIAGNOSIS — R1084 Generalized abdominal pain: Secondary | ICD-10-CM | POA: Insufficient documentation

## 2021-02-18 DIAGNOSIS — I10 Essential (primary) hypertension: Secondary | ICD-10-CM | POA: Insufficient documentation

## 2021-02-18 DIAGNOSIS — R112 Nausea with vomiting, unspecified: Secondary | ICD-10-CM | POA: Diagnosis not present

## 2021-02-18 DIAGNOSIS — K259 Gastric ulcer, unspecified as acute or chronic, without hemorrhage or perforation: Secondary | ICD-10-CM

## 2021-02-18 DIAGNOSIS — Z7982 Long term (current) use of aspirin: Secondary | ICD-10-CM | POA: Insufficient documentation

## 2021-02-18 LAB — CBC WITH DIFFERENTIAL/PLATELET
Abs Immature Granulocytes: 0.06 10*3/uL (ref 0.00–0.07)
Basophils Absolute: 0.1 10*3/uL (ref 0.0–0.1)
Basophils Relative: 0 %
Eosinophils Absolute: 0.2 10*3/uL (ref 0.0–0.5)
Eosinophils Relative: 2 %
HCT: 41.2 % (ref 36.0–46.0)
Hemoglobin: 14.2 g/dL (ref 12.0–15.0)
Immature Granulocytes: 1 %
Lymphocytes Relative: 17 %
Lymphs Abs: 2.1 10*3/uL (ref 0.7–4.0)
MCH: 28 pg (ref 26.0–34.0)
MCHC: 34.5 g/dL (ref 30.0–36.0)
MCV: 81.1 fL (ref 80.0–100.0)
Monocytes Absolute: 0.8 10*3/uL (ref 0.1–1.0)
Monocytes Relative: 7 %
Neutro Abs: 9.2 10*3/uL — ABNORMAL HIGH (ref 1.7–7.7)
Neutrophils Relative %: 73 %
Platelets: 264 10*3/uL (ref 150–400)
RBC: 5.08 MIL/uL (ref 3.87–5.11)
RDW: 13.1 % (ref 11.5–15.5)
WBC: 12.4 10*3/uL — ABNORMAL HIGH (ref 4.0–10.5)
nRBC: 0 % (ref 0.0–0.2)

## 2021-02-18 LAB — URINALYSIS, ROUTINE W REFLEX MICROSCOPIC
Bacteria, UA: NONE SEEN
Bilirubin Urine: NEGATIVE
Glucose, UA: NEGATIVE mg/dL
Hgb urine dipstick: NEGATIVE
Ketones, ur: NEGATIVE mg/dL
Leukocytes,Ua: NEGATIVE
Nitrite: NEGATIVE
Protein, ur: NEGATIVE mg/dL
Specific Gravity, Urine: <1.005 — ABNORMAL LOW (ref 1.005–1.030)
Squamous Epithelial / HPF: NONE SEEN (ref 0–5)
pH: 6 (ref 5.0–8.0)

## 2021-02-18 LAB — COMPREHENSIVE METABOLIC PANEL
ALT: 18 U/L (ref 0–44)
AST: 21 U/L (ref 15–41)
Albumin: 4.2 g/dL (ref 3.5–5.0)
Alkaline Phosphatase: 100 U/L (ref 38–126)
Anion gap: 7 (ref 5–15)
BUN: 13 mg/dL (ref 8–23)
CO2: 25 mmol/L (ref 22–32)
Calcium: 9 mg/dL (ref 8.9–10.3)
Chloride: 107 mmol/L (ref 98–111)
Creatinine, Ser: 0.51 mg/dL (ref 0.44–1.00)
GFR, Estimated: >60 mL/min (ref >60)
Glucose, Bld: 156 mg/dL — ABNORMAL HIGH (ref 70–99)
Potassium: 3.5 mmol/L (ref 3.5–5.1)
Sodium: 139 mmol/L (ref 135–145)
Total Bilirubin: 0.8 mg/dL (ref 0.3–1.2)
Total Protein: 7.6 g/dL (ref 6.5–8.1)

## 2021-02-18 LAB — LACTIC ACID, PLASMA: Lactic Acid, Venous: 1 mmol/L (ref 0.5–1.9)

## 2021-02-18 LAB — LIPASE, BLOOD: Lipase: 33 U/L (ref 11–51)

## 2021-02-18 LAB — TROPONIN I (HIGH SENSITIVITY): Troponin I (High Sensitivity): 12 ng/L (ref <18)

## 2021-02-18 MED ORDER — ONDANSETRON HCL 4 MG/2ML IJ SOLN
4.0000 mg | Freq: Once | INTRAMUSCULAR | Status: AC
  Administered 2021-02-18: 4 mg via INTRAVENOUS
  Filled 2021-02-18: qty 2

## 2021-02-18 MED ORDER — OMEPRAZOLE 20 MG PO CPDR: 20.0000 mg | DELAYED_RELEASE_CAPSULE | Freq: Two times a day (BID) | ORAL | 0 refills | Status: DC

## 2021-02-18 MED ORDER — METOPROLOL SUCCINATE ER 25 MG PO TB24: 25.0000 mg | ORAL_TABLET | Freq: Two times a day (BID) | ORAL | 0 refills | Status: DC

## 2021-02-18 MED ORDER — MORPHINE SULFATE (PF) 4 MG/ML IV SOLN
4.0000 mg | Freq: Once | INTRAVENOUS | Status: AC
  Administered 2021-02-18: 4 mg via INTRAVENOUS
  Filled 2021-02-18: qty 1

## 2021-02-18 MED ORDER — LACTATED RINGERS IV BOLUS (SEPSIS)
1000.0000 mL | Freq: Once | INTRAVENOUS | Status: AC
  Administered 2021-02-18: 1000 mL via INTRAVENOUS

## 2021-02-18 MED ORDER — IOHEXOL 350 MG/ML SOLN
100.0000 mL | Freq: Once | INTRAVENOUS | Status: AC | PRN
  Administered 2021-02-18: 100 mL via INTRAVENOUS

## 2021-02-18 MED ORDER — METOPROLOL TARTRATE 25 MG PO TABS
12.5000 mg | ORAL_TABLET | Freq: Once | ORAL | Status: AC
  Administered 2021-02-18: 12.5 mg via ORAL
  Filled 2021-02-18: qty 1

## 2021-02-18 MED ORDER — ALUM & MAG HYDROXIDE-SIMETH 200-200-20 MG/5ML PO SUSP
30.0000 mL | Freq: Once | ORAL | Status: AC
  Administered 2021-02-18: 30 mL via ORAL
  Filled 2021-02-18: qty 30

## 2021-02-18 MED ORDER — LIDOCAINE VISCOUS HCL 2 % MT SOLN
15.0000 mL | Freq: Once | OROMUCOSAL | Status: AC
  Administered 2021-02-18: 15 mL via ORAL
  Filled 2021-02-18: qty 15

## 2021-02-18 MED ORDER — RIVAROXABAN 20 MG PO TABS: 20.0000 mg | ORAL_TABLET | Freq: Every day | ORAL | 0 refills | Status: DC

## 2021-02-18 NOTE — ED Notes (Signed)
Pt ambulatory to waiting room. Pt verbalized understanding of discharge instructions.   

## 2021-02-18 NOTE — Discharge Instructions (Signed)
Para sus medicamentos: - DEJAR de aspirina - INICIO Xarelto - AUMENTAR el Metoprolol de una vez al da a dos veces al da - He escrito una receta NUEVA para esto. Deseche/deje a un lado su botella vieja.  SEGUIMIENTO con Cardiologa en la prxima 1 semana  SEGUIMIENTO con su mdico de cabecera en 1 semana  TOME el anticido Omeprazol DOS VECES AL DA para su estmago     For your medications: - STOP aspirin - START Xarelto - INCREASE the Metoprolol from once a day to twice a day - I have written a NEW prescription for this. Throw away/set aside your old bottle.  FOLLOW-UP with Cardiology in the next 1 week  FOLLOW-UP with your primary doctor in 1 week  TAKE the Omeprazole antacid TWICE A DAY for your stomach

## 2021-02-18 NOTE — ED Triage Notes (Signed)
Patient ambulatory to triage with steady gait, without difficulty or distress noted; per [employer] video interpreter, pt reports having generalized abd pain x 3 days accomp by nausea; st hx of same with cholecystectomy

## 2021-02-18 NOTE — ED Provider Notes (Signed)
Assumed care from Dr. York Cerise at 7 AM. Briefly, the patient is a 70 y.o. female with PMHx of  has a past medical history of Hypertension and Thyroid disease. here with upper abdominal discomfort/pain. Incidentally noted to be in new onset AFib. C/o upper abd pain similar to her prior biliary colic, but is now s/p cholecystectomy. Labs reviewed, show mild leukocytosis but normal LFTs, renal function. Plan to f/u CT, trop, reassess.   Labs Reviewed  COMPREHENSIVE METABOLIC PANEL - Abnormal; Notable for the following components:      Result Value   Glucose, Bld 156 (*)    All other components within normal limits  CBC WITH DIFFERENTIAL/PLATELET - Abnormal; Notable for the following components:   WBC 12.4 (*)    Neutro Abs 9.2 (*)    All other components within normal limits  URINALYSIS, ROUTINE W REFLEX MICROSCOPIC - Abnormal; Notable for the following components:   Specific Gravity, Urine <1.005 (*)    All other components within normal limits  URINE CULTURE  CULTURE, BLOOD (SINGLE)  LACTIC ACID, PLASMA  LIPASE, BLOOD  LACTIC ACID, PLASMA  TROPONIN I (HIGH SENSITIVITY)    Course of Care: Pt feels completely better with meds in ED. She is now in NSR with HR 80s. BP normal.   Re: abd pain - suspect possible gastritis. H/o same. No anemia or signs of bleed. No melena. LFTs normal. CT reviewed and is unremarkable. Unlikely referred cardiac pain and she is now in nSR, trop negative. Will tx with PPI, supportive care, avoid NSAIDs, hold ASA.  Re: afib - this is new. CHADS-Vasc of 3. Had a long discussion with pt re: management, risks/benefits. She is already on lopressor. Will dc ASA and start on Xarelto (h/o ? Valvular disease). Refer to cards for f/u this week. Will increase her lopressor from 25 qd to 25 bid. This was discussed in detail and outlined on paper. Interpreter to help arrange f/u.     Shaune Pollack, MD 02/18/21 [phone]

## 2021-02-18 NOTE — ED Provider Notes (Signed)
[facility] Emergency Department Provider Note  ____________________________________________   Event Date/Time   First MD Initiated Contact with Patient 02/18/21 [phone]     (approximate)  I have reviewed the triage vital signs and the nursing notes.   HISTORY  Chief Complaint Abdominal Pain  The patient and/or family speak(s) Spanish.  They understand they have the right to the use of a hospital interpreter, however at this time they prefer to speak directly with me in [place].  They know that they can ask for an interpreter at any time.   HPI Kristi Conrad is a 70 y.o. female with medical history as listed below which notably includes a cholecystectomy earlier this year.  She presents for evaluation of about 3 days of persistent upper abdominal pain but also some pain in her lower abdomen.  She has had some persistent nausea and 2 episodes of vomiting.  She has had that sometimes the symptoms will resolve with that will come right back and are worse after she eats.  She denies any diarrhea or constipation.  Nothing particular makes the symptoms better and they seem to get worse after she tries to eat anything.  She denies fever, sore throat, chest pain, shortness of breath.  She has had no recent dysuria.     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   H/O atrial septal defect repair 12/27/2016   Hyperlipidemia 12/27/2016   Polyneuropathy 10/20/2014    Spondylosis of cervical region without myelopathy or radiculopathy 07/13/2014   Acquired hypothyroidism 04/22/2014   Chronic pain of left heel 04/22/2014   Heart valve disease 04/22/2014    Past Surgical History:  Procedure Laterality Date   ABDOMINAL HYSTERECTOMY     CARDIAC SURGERY     CHOLECYSTECTOMY     COLONOSCOPY WITH PROPOFOL N/A 11/30/2017   Procedure: COLONOSCOPY WITH PROPOFOL;  Surgeon: Virgel Manifold, MD;  Location: [facility] ENDOSCOPY;  Service: Endoscopy;  Laterality: N/A;   COLONOSCOPY WITH PROPOFOL N/A 04/14/2019   Procedure: COLONOSCOPY WITH PROPOFOL;  Surgeon: Lin Landsman, MD;  Location: [place];  Service: Endoscopy;  Laterality: N/A;   ESOPHAGOGASTRODUODENOSCOPY (EGD) WITH PROPOFOL N/A 11/30/2017   Procedure: ESOPHAGOGASTRODUODENOSCOPY (EGD) WITH PROPOFOL;  Surgeon: Virgel Manifold, MD;  Location: [facility] ENDOSCOPY;  Service: Endoscopy;  Laterality: N/A;   ESOPHAGOGASTRODUODENOSCOPY (EGD) WITH PROPOFOL N/A 04/14/2019   Procedure: ESOPHAGOGASTRODUODENOSCOPY (EGD) WITH PROPOFOL;  Surgeon: Lin Landsman, MD;  Location: [place];  Service: Endoscopy;  Laterality: N/A;    Prior to Admission medications   Medication Sig Start Date End Date Taking? Authorizing Provider  aspirin EC 81 MG tablet Take by mouth.    [provider]  DULoxetine (CYMBALTA) 30 MG capsule  07/31/17   [provider]  gabapentin (NEURONTIN) 300 MG capsule Take 1 capsule in the morning and 2 capsules at night. 02/05/17   [provider]  levothyroxine (SYNTHROID, [place])  50 MCG tablet  08/21/17   [provider]  losartan (COZAAR) 25 MG tablet Take by mouth. 11/22/17   [provider]  losartan (COZAAR) 50 MG tablet  07/16/17   [provider]  metoprolol succinate (TOPROL-XL) 25 MG 24 hr tablet Take 25 mg by mouth daily. 08/03/17   [provider]  omeprazole (PRILOSEC) 20 MG capsule Take 1 capsule (20  mg total) by mouth 2 (two) times daily before a meal. 04/14/19 05/14/19  Vanga, Loel Dubonnet, MD    Allergies Patient has no known allergies.  Family History  Problem Relation Age of Onset   Breast cancer Neg Hx     Social History Social History   Tobacco Use   Smoking status: Never   Smokeless tobacco: Never  Substance Use Topics   Alcohol use: Yes    Review of Systems Constitutional: No fever/chills Eyes: No visual changes. ENT: No sore throat. Cardiovascular: Denies chest pain. Respiratory: Denies shortness of breath. Gastrointestinal: Abdominal pain with nausea and vomiting x3 days, waxing and waning. Genitourinary: Negative for dysuria. Musculoskeletal: Negative for neck pain.  Negative for back pain. Integumentary: Negative for rash. Neurological: Negative for headaches, focal weakness or numbness.   ____________________________________________   PHYSICAL EXAM:  VITAL SIGNS: ED Triage Vitals  Enc Vitals Group     BP 02/18/21 0517 116/86     Pulse Rate 02/18/21 0517 (!) 106     Resp 02/18/21 0517 19     Temp 02/18/21 0517 98.5 F (36.9 C)     Temp Source 02/18/21 0517 Oral     SpO2 02/18/21 0517 95 %     Weight 02/18/21 0524 67.6 kg (149 lb)     Height 02/18/21 0524 1.6 m (5\' 3" )     Head Circumference --      Peak Flow --      Pain Score 02/18/21 0524 8     Pain Loc --      Pain Edu? --      Excl. in GC? --     Constitutional: Alert and oriented.  Eyes: Conjunctivae are normal.  Head: Atraumatic. Nose: No congestion/rhinnorhea. Mouth/Throat: Patient is wearing a mask. Neck: No stridor.  No meningeal signs.   Cardiovascular: Normal rate, regular rhythm. Good peripheral circulation. Respiratory: Normal respiratory effort.  No retractions. Gastrointestinal: Soft and nondistended.  Tenderness to palpation of the epigastrium and right upper quadrant but with equivocal Murphy sign.  No lower abdominal tenderness. Musculoskeletal: No lower extremity  tenderness nor edema. No gross deformities of extremities. Neurologic:  Normal speech and language. No gross focal neurologic deficits are appreciated.  Skin:  Skin is warm, dry and intact. Psychiatric: Mood and affect are normal. Speech and behavior are normal.  ____________________________________________   LABS (all labs ordered are listed, but only abnormal results are displayed)  Labs Reviewed  COMPREHENSIVE METABOLIC PANEL - Abnormal; Notable for the following components:      Result Value   Glucose, Bld 156 (*)    All other components within normal limits  CBC WITH DIFFERENTIAL/PLATELET - Abnormal; Notable for the following components:   WBC 12.4 (*)    Neutro Abs 9.2 (*)    All other components within normal limits  URINALYSIS, ROUTINE W REFLEX MICROSCOPIC - Abnormal; Notable for the following components:   Specific Gravity, Urine <1.005 (*)    All other components within normal limits  URINE CULTURE  CULTURE, BLOOD (SINGLE)  LACTIC ACID, PLASMA  LIPASE, BLOOD  LACTIC ACID,  PLASMA  TROPONIN I (HIGH SENSITIVITY)   ____________________________________________  EKG  ED ECG REPORT I, Hinda Kehr, the attending physician, personally viewed and interpreted this ECG.  Date: 02/18/2021 EKG Time: 5:34 AM Rate: 98 Rhythm: Atrial fibrillation QRS Axis: normal Intervals: normal ST/T Wave abnormalities: Minor ST depression in (about 1 mm) is present in leads V4, V5.  Patient also has atrial fibrillation which seems to be a new finding. Narrative Interpretation: Question of acute ischemia versus nonspecific changes, also new onset atrial fibrillation.  ____________________________________________   INITIAL IMPRESSION / MDM / ASSESSMENT AND PLAN / ED COURSE  As part of my medical decision making, I reviewed the following data within the [place] notes reviewed and incorporated, Labs reviewed , EKG interpreted , Old chart reviewed, and Notes from  prior ED visits   Differential diagnosis includes, but is not limited to, persistent biliary obstruction, pancreatitis, SBO/ileus.  Patient is tachycardic with abdominal pain and is over age of 21.  Will initiate "possible sepsis" work-up, but I doubt the patient is truly septic.  She likely has some tachycardia due to decreased oral intake over the last several days.  Ordering LR 1 L IV bolus.  The patient declines any medication for pain or nausea at this time.  Will evaluate broadly with labs and then most likely obtain a CT scan of the abdomen and pelvis.       Clinical Course as of 02/18/21 0748  Fri Feb 18, 2021  0719 WBC(!): 12.4 Mild leukocytosis.  Metabolic panel pending.  Signed out patient to Dr. Ellender Hose to follow up labs and order a CT abd/pelvis. [CF]  0724 Normal lactic acid and lipase.  CMP unremarkable.  Ordering CT abd/pelvis with IV contrast.  Updated Dr. Ellender Hose. [CF]    Clinical Course User Index [CF] Hinda Kehr, MD     ____________________________________________  FINAL CLINICAL IMPRESSION(S) / ED DIAGNOSES  Final diagnoses:  Generalized abdominal pain  Nausea and vomiting, unspecified vomiting type  New onset atrial fibrillation ([place])     MEDICATIONS GIVEN DURING THIS VISIT:  Medications  lactated ringers bolus 1,000 mL (1,000 mLs Intravenous New Bag/Given 02/18/21 0649)  iohexol (OMNIPAQUE) 350 MG/ML injection 100 mL (100 mLs Intravenous Contrast Given 02/18/21 0743)     ED Discharge Orders     None        Note:  This document was prepared using Dragon voice recognition software and may include unintentional dictation errors.   Hinda Kehr, MD 02/18/21 [phone]

## 2021-02-19 LAB — URINE CULTURE: Culture: 50000 — AB

## 2021-02-23 LAB — CULTURE, BLOOD (SINGLE)
Culture: NO GROWTH
Special Requests: ADEQUATE

## 2021-03-17 ENCOUNTER — Ambulatory Visit (INDEPENDENT_AMBULATORY_CARE_PROVIDER_SITE_OTHER): Payer: Medicare Other | Admitting: Cardiology

## 2021-03-17 ENCOUNTER — Other Ambulatory Visit: Payer: Self-pay

## 2021-03-17 ENCOUNTER — Encounter: Payer: Self-pay | Admitting: Cardiology

## 2021-03-17 ENCOUNTER — Ambulatory Visit (INDEPENDENT_AMBULATORY_CARE_PROVIDER_SITE_OTHER): Payer: Medicare Other

## 2021-03-17 VITALS — BP 136/60 | HR 62 | Ht 62.0 in | Wt 150.1 lb

## 2021-03-17 DIAGNOSIS — E039 Hypothyroidism, unspecified: Secondary | ICD-10-CM

## 2021-03-17 DIAGNOSIS — R0789 Other chest pain: Secondary | ICD-10-CM

## 2021-03-17 DIAGNOSIS — M7989 Other specified soft tissue disorders: Secondary | ICD-10-CM

## 2021-03-17 DIAGNOSIS — Z8774 Personal history of (corrected) congenital malformations of heart and circulatory system: Secondary | ICD-10-CM

## 2021-03-17 DIAGNOSIS — E782 Mixed hyperlipidemia: Secondary | ICD-10-CM | POA: Diagnosis not present

## 2021-03-17 DIAGNOSIS — I4891 Unspecified atrial fibrillation: Secondary | ICD-10-CM | POA: Diagnosis not present

## 2021-03-17 DIAGNOSIS — I420 Dilated cardiomyopathy: Secondary | ICD-10-CM

## 2021-03-17 DIAGNOSIS — R002 Palpitations: Secondary | ICD-10-CM | POA: Diagnosis not present

## 2021-03-17 DIAGNOSIS — R072 Precordial pain: Secondary | ICD-10-CM

## 2021-03-17 DIAGNOSIS — I480 Paroxysmal atrial fibrillation: Secondary | ICD-10-CM | POA: Insufficient documentation

## 2021-03-17 DIAGNOSIS — I700 Atherosclerosis of aorta: Secondary | ICD-10-CM

## 2021-03-17 DIAGNOSIS — R0602 Shortness of breath: Secondary | ICD-10-CM

## 2021-03-17 MED ORDER — RIVAROXABAN 20 MG PO TABS: 20.0000 mg | ORAL_TABLET | Freq: Every day | ORAL | 3 refills | Status: DC

## 2021-03-17 MED ORDER — METOPROLOL SUCCINATE ER 25 MG PO TB24: 25.0000 mg | ORAL_TABLET | Freq: Two times a day (BID) | ORAL | 3 refills | Status: DC

## 2021-03-17 NOTE — Patient Instructions (Signed)
Medication Instructions:  Your physician recommends that you continue on your current medications as directed. Please refer to the Current Medication list given to you today.   *If you need a refill on your cardiac medications before your next appointment, please call your pharmacy*   Lab Work:  Your provider has ordered labs to be drawn today (TSH, CBC, CMP). These will be drawn before you leave the office.   If you have labs (blood work) drawn today and your tests are completely normal, you will receive your results only by: [place] (if you have MyChart) OR A paper copy in the mail If you have any lab test that is abnormal or we need to change your treatment, we will call you to review the results.   Testing/Procedures:  Echocardiogram - Your physician has requested that you have an echocardiogram. Echocardiography is a painless test that uses sound waves to create images of your heart. It provides your doctor with information about the size and shape of your heart and how well your hearts chambers and valves are working. This procedure takes approximately one hour. There are no restrictions for this procedure.   2. Your provider has ordered a lower extremity doppler of your legs to check for DVT. You will come to our office to have this completed. There is no prep necessary for this. Plan to be in our office for approximately 90 minutes.   3. Your cardiac CT is February 2nd, 2023 at 8 am  [facility] [address] [place], [place] [address] [phone]  Please arrive 15 mins early for check-in and test prep.    Please follow these instructions carefully (unless otherwise directed):   On the Day of the Test: Drink plenty of water until 1 hour prior to the test. Do not eat any food 4 hours prior to the test. You may take your regular medications prior to the test.  Take metoprolol (Lopressor) two hours prior to  test. FEMALES- please wear underwire-free bra if available, avoid dresses & tight clothing        After the Test: Drink plenty of water. After receiving IV contrast, you may experience a mild flushed feeling. This is normal. On occasion, you may experience a mild rash up to 24 hours after the test. This is not dangerous. If this occurs, you can take Benadryl 25 mg and increase your fluid intake. If you experience trouble breathing, this can be serious. If it is severe call 911 IMMEDIATELY. If it is mild, please call our office. If you take any of these medications: Glipizide/Metformin, Avandament, Glucavance, please do not take 48 hours after completing test unless otherwise instructed.  Please allow 2-4 weeks for scheduling of routine cardiac CTs. Some insurance companies require a pre-authorization which may delay scheduling of this test.   For non-scheduling related questions, please contact the cardiac imaging nurse navigator should you have any questions/concerns: Marchia Bond, Cardiac Imaging Nurse Navigator Gordy Clement, Cardiac Imaging Nurse Navigator [facility] Direct Office Dial: [phone]   For scheduling needs, including cancellations and rescheduling, please call [place], [phone].    Follow-Up: At [facility], you and your health needs are our priority.  As part of our continuing mission to provide you with exceptional heart care, we have created designated Provider Care Teams.  These Care Teams include your primary Cardiologist (physician) and Advanced Practice Providers (APPs -  Physician Assistants and Nurse Practitioners) who all work  together to provide you with the care you need, when you need it.  We recommend signing up for the patient portal called "MyChart".  Sign up information is provided on this After Visit Summary.  MyChart is used to connect with patients for Virtual Visits (Telemedicine).  Patients are able to view  lab/test results, encounter notes, upcoming appointments, etc.  Non-urgent messages can be sent to your provider as well.   To learn more about what you can do with MyChart, go to [email or web address].    Your next appointment:   3 week(s)  The format for your next appointment:   In Person  Provider:   You may see Glenetta Hew, MD or one of the following Advanced Practice Providers on your designated Care Team:   Murray Hodgkins, NP Christell Faith, PA-C Cadence Kathlen Mody, PA-C:1}    Other Instructions N/A

## 2021-03-17 NOTE — Progress Notes (Signed)
Primary Care Provider: Freddy Finner, NP [facility] Cardiologist: None Electrophysiologist: None  Clinic Note: Chief Complaint  Patient presents with   New Patient (Initial Visit)    Follow up [facility]; new onset A-Fib. Patient c/o rapid heart beats and shortness of breath. Medications reviewed by the patient's medication list from the hospital. Former Dr. Saralyn Pilar patient with a Hx. status post ASD repair 35 years ago [place]   Atrial Fibrillation    Diagnosed with A. fib in the ER when presenting with abdominal pain.   Hospitalization Follow-up   ===================================  ASSESSMENT/PLAN   Problem List Items Addressed This Visit       Cardiology Problems   New onset atrial fibrillation ([place]) - Primary    New onset A. fib in a patient who presented with abdominal pain.  Quite likely would have the abdominal pain was was a triggering event.  She does not adequately hydrate.  She does have risk for arrhythmia based on ASD repair and likely atrial enlargement.  She also had moderate reduced EF by echo couple years ago.  Has not been reassessed since then.  He has only been on Toprol. Unusual chest discomfort symptoms that are somewhat bothersome in a patient with new onset A. Fib.  This patients CHA2DS2-VASc Score and unadjusted Ischemic Stroke Rate (% per year) is equal to 7.2 % stroke rate/year from a score of 5  Above score calculated as 1 point each if present [CHF, HTN, DM,Vascular=MI/PAD/ Aortic Plaque, Age if 65-74, or Female]; 2 points each if present [Age > 75, or Stroke/TIA/TE]   Plan: Refill Xarelto 2D echocardiogram Ischemic evaluation with Coronary CTA Check TSH and CBC along with CMP. We will also check an event monitor for 214-day Zio patch is-she would like to wait until she returns from [place] (they are open to fly out on February 5-we can order when she returns in follow-up after her initial studies)       Relevant Medications   metoprolol  succinate (TOPROL-XL) 25 MG 24 hr tablet   rivaroxaban (XARELTO) 20 MG TABS tablet   Other Relevant Orders   EKG 12-Lead   Dilated cardiomyopathy (HCC) (Chronic)    Prior history of ASD repair.  Had an echocardiogram not that long ago that was a stress echo estimated EF 40% improved to 45% with exertion.  Has been lost to follow-up since.  Thankfully, she is not really complaining of any heart failure symptoms besides the swelling which actually is probably more from venous stasis/venous insufficiency.  Plan:  Check 2D echocardiogram Continue Toprol for now. Low threshold to add ARB in follow-up. NYHA class I symptoms; euvolemic.  No diuretic requirement.      Relevant Medications   metoprolol succinate (TOPROL-XL) 25 MG 24 hr tablet   rivaroxaban (XARELTO) 20 MG TABS tablet   Other Relevant Orders   ECHOCARDIOGRAM COMPLETE   Hyperlipidemia (Chronic)   Relevant Medications   metoprolol succinate (TOPROL-XL) 25 MG 24 hr tablet   rivaroxaban (XARELTO) 20 MG TABS tablet   Aortic atherosclerosis (HCC) (Chronic)    Aortic atherosclerosis seen on CT scan.  Will initiate cardiovascular risk factor modification.  He is currently on beta-blocker.  Not currently on aspirin because of Xarelto.  Rechecking coronary CT angiogram to his evaluate extent of CAD.  Would likely just lipid management based on these results.  Also need to ensure adequate glycemic and blood pressure control..      Relevant Medications   metoprolol succinate (TOPROL-XL) 25  MG 24 hr tablet   rivaroxaban (XARELTO) 20 MG TABS tablet     Other   SOB (shortness of breath) on exertion    Not currently complaining of exertional dyspnea but has had that in the past.  She is not very active now and probably may not be stressing as of now.  Plan: Check 2D echocardiogram and Coronary CTA      Chest tightness    Chest tightness that is associated with new onset of A. fib and documented cardiomyopathy.  Plan: Ischemic  evaluation with Coronary CTA.  CT FFR if needed. Will check pre-CT labs.      Relevant Orders   ECHOCARDIOGRAM COMPLETE   CBC (Completed)   Swelling of lower extremity    I did not see that she had any evaluation of her lower extremities with her spider veins/varicose veins and edema.  She does have symptoms to suggest stasis insufficiency and help was told to wear support stockings.  She did seem familiar with this.  Plan: Foot elevation and support stockings Lower extremity venous Dopplers to evaluate for DVT based on unilateral pain but also to evaluate for reflux      Relevant Orders   VAS [place] LOWER EXTREMITY VENOUS (DVT) (Completed)   Hypothyroidism (Chronic)    With known hypothyroidism on Synthroid, need to ensure thyroid levels are stable.      Relevant Medications   metoprolol succinate (TOPROL-XL) 25 MG 24 hr tablet   H/O atrial septal defect repair (Chronic)    Not commented on as far as I can tell by echocardiogram done for stress echo.  I have not seen an echocardiogram here in the states besides a stress echo. She probably in the past had at least some right-sided failure that may or may not have improved.  EF was previously documented to be 40%  Plan: Check 2D echo.  It is possible that A. fib could have been triggered by the ASD repair.  Usually less atrial flutter, this was clearly A. fib.      Heart palpitations (Chronic)   Relevant Orders   EKG 12-Lead   Comp Met (CMET) (Completed)   TSH (Completed)   Other Visit Diagnoses     Precordial pain       Relevant Orders   CT CORONARY MORPH W/CTA COR W/SCORE W/CA W/CM &/OR WO/CM      ===================================  HPI:    Kristi Conrad is a 71 y.o. female with History of ASD Repair (in [place] in 1986) with Mild Nonischemic Cardiomyopathy who is being seen today for the evaluation of [place] at the request of Freddy Finner, NP.  [place] was seen on  November 12, 2017 by Dr. Saralyn Pilar from [facility]..  She had noticed having intermittent chest pain.  She also noted some occasional heart racing.  Chest pain likely atypical in nature.  Refilled doses of losartan and Toprol at 25 mg.  Deferred invasive evaluation.  Recent Hospitalizations:  [facility] ER 02/18/2021: Abdominal pain.   She noted about 3 days history of persistent abdominal pain mostly upper but also in the lower abdomen.  Had nausea with least 2 episodes of vomiting.  Usually symptoms are worse after eating.  After initially improving.  No diarrhea or constipation. Noted to be in A. fib RVR => Started on rivaroxaban, and home dose Toprol continued; CHA2DS2-VASc score calculated at 3. Ruled out for sepsis, Biliary obstruction, ileus or pancreatitis. Given  1 L fluid and abdominal CT ordered.  Patient is dominantly [place] speaker-native of [place].  Interview performed with assistance of [place].   Reviewed  CV studies:    The following studies were reviewed today: (if available, images/films reviewed: From Epic Chart or Care Everywhere) [place] [phone]): Normal LV function.  No ischemia. Stress Echocardiogram 01/17/2017 : Mildly reduced LV function EF 40%.  Improved to 45% with exercise.  Interval History:   sofia vanmeter presents here for hospital follow-up.  She tells me that she went to the hospital because of Upper abdominal discomfort with nausea.  She did end up in a cardiology office.  She tells me that she does occasionally feel her heart rate going fast for refill spells lasting maybe 30 seconds.  She has not really noted any prolonged irregular heartbeats or palpitations.  Certainly not any prolonged tachycardia.  Certainly nothing to suggest that she feels atrial fibrillation.  She did not have any sensation of it in the hospital.  She really has not had any chest discomfort.  Only noted with irregular heartbeats  and skipped beats when she is depressed or feeling upset or stressed.  She complains of bilateral lower extremity aching and pain with burning and tingling.  States difficult to walk distances.  She has had longstanding varicose veins.  They did not indicate to me that she was seen by [facility] vascular surgery.  (This was seen with extensive care everywhere review)  CV Review of Symptoms (Summary) Cardiovascular ROS: positive for - dyspnea on exertion, irregular heartbeat, palpitations, and See only notes palpitations or irregular heartbeats may be occasional chest discomfort when she is depressed or feels stressed.  Not really with activity. Bilateral numbness and aching.  Has mild edema.  Off-and-on discomfort mostly in the left leg with redness and swelling.  She also feels her heart beating fast on occasion.  The palpitations last maybe 30 seconds. negative for - loss of consciousness, orthopnea, paroxysmal nocturnal dyspnea, shortness of breath, or TIA/amaurosis fugax, claudication.  REVIEWED OF SYSTEMS   Review of Systems  Constitutional:  Positive for malaise/fatigue. Negative for fever and weight loss.  HENT:  Negative for congestion and nosebleeds.   Respiratory:  Positive for shortness of breath (On occasion.). Negative for cough.   Cardiovascular:        Per HPI  Gastrointestinal:  Negative for melena.  Genitourinary:  Negative for hematuria.  Musculoskeletal:  Positive for joint pain.       Bilateral legs, below the knees hurt.  Occasional redness and swelling in left be  Neurological:  Positive for dizziness and weakness (Generalized). Negative for focal weakness.  Psychiatric/Behavioral:  Negative for depression (Hard to assess due to language barrier with poor interpretation) and memory loss. The patient is not nervous/anxious and does not have insomnia.   All other systems reviewed and are negative.  I have reviewed and (if needed) personally updated the patient's problem list,  medications, allergies, past medical and surgical history, social and family history.   PAST MEDICAL HISTORY   Past Medical History:  Diagnosis Date   Acquired hypothyroidism    Atrial fibrillation ([place]) 02/2021   Cardiomyopathy ([place]) 01/2017   Echo showed mildly reduced EF of roughly 40%-increase to 45% with exercise.   H/O congenital atrial septal defect (ASD) repair 1984   In [place]   Hypertension    Positive ANA (antinuclear antibody)    Has polyneuropathy   Varicose veins of  both lower extremities    Actually seen by [facility]-November 2022    PAST SURGICAL HISTORY   Past Surgical History:  Procedure Laterality Date   ABDOMINAL HYSTERECTOMY     ASD REPAIR  1984   Unknown details.  Was performed while living in [place].   CHOLECYSTECTOMY     COLONOSCOPY WITH PROPOFOL N/A 11/30/2017   Procedure: COLONOSCOPY WITH PROPOFOL;  Surgeon: Virgel Manifold, MD;  Location: [facility] ENDOSCOPY;  Service: Endoscopy;  Laterality: N/A;   COLONOSCOPY WITH PROPOFOL N/A 04/14/2019   Procedure: COLONOSCOPY WITH PROPOFOL;  Surgeon: Lin Landsman, MD;  Location: [place];  Service: Endoscopy;  Laterality: N/A;   ESOPHAGOGASTRODUODENOSCOPY (EGD) WITH PROPOFOL N/A 11/30/2017   Procedure: ESOPHAGOGASTRODUODENOSCOPY (EGD) WITH PROPOFOL;  Surgeon: Virgel Manifold, MD;  Location: [facility] ENDOSCOPY;  Service: Endoscopy;  Laterality: N/A;   ESOPHAGOGASTRODUODENOSCOPY (EGD) WITH PROPOFOL N/A 04/14/2019   Procedure: ESOPHAGOGASTRODUODENOSCOPY (EGD) WITH PROPOFOL;  Surgeon: Lin Landsman, MD;  Location: [place];  Service: Endoscopy;  Laterality: N/A;   NM [facility] [facility]TD Bilateral 2016   [facility]: Normal LV function.  No ischemia.   Stress Echocardiogram  01/2017   ([facility]) mildly reduced LV function.  EF 40%.  Improved to 45% with exercise.     There is no immunization history on file for this patient.  MEDICATIONS/ALLERGIES   Current Meds   Medication Sig   levothyroxine (SYNTHROID, LEVOTHROID) 50 MCG tablet Take 50 mcg by mouth daily before breakfast.   omeprazole (PRILOSEC) 20 MG capsule Take 1 capsule (20 mg total) by mouth 2 (two) times daily before a meal.   [DISCONTINUED] metoprolol succinate (TOPROL-XL) 25 MG 24 hr tablet Take 1 tablet (25 mg total) by mouth in the morning and at bedtime.   [DISCONTINUED] rivaroxaban (XARELTO) 20 MG TABS tablet Take 1 tablet (20 mg total) by mouth daily with supper.    No Known Allergies  SOCIAL HISTORY/FAMILY HISTORY   Reviewed in Epic:   Social History   Tobacco Use   Smoking status: Never   Smokeless tobacco: Never  Vaping Use   Vaping Use: Never used  Substance Use Topics   Alcohol use: Not Currently   Drug use: Never   Social History   Social History Narrative   She and her husband are originally from [place].  They do not speak [place].      Due to language barrier history, not able to obtain much information.      She has less than high school graduate level education.   Family History  Problem Relation Age of Onset   Stroke Mother    Breast cancer Neg Hx   No family history of rheumatoid arthritis or lupus.  OBJCTIVE -PE, EKG, labs   Wt Readings from Last 3 Encounters:  03/17/21 150 lb 2 oz (68.1 kg)  02/18/21 149 lb (67.6 kg)  04/14/19 154 lb (69.9 kg)    Physical Exam: BP 136/60 (BP Location: Right Arm, Patient Position: Sitting, Cuff Size: Normal)    Pulse 62    Ht _0  (1.575 m)    Wt 150 lb 2 oz (68.1 kg)    SpO2 98%    BMI 27.46 kg/m  Physical Exam Vitals reviewed.  Constitutional:      General: She is not in acute distress.    Appearance: Normal appearance. She is normal weight. She is not toxic-appearing.  HENT:     Head: Normocephalic and atraumatic.  Neck:  Vascular: No carotid bruit or JVD.  Cardiovascular:     Rate and Rhythm: Normal rate and regular rhythm. No extrasystoles are present.    Chest Wall: PMI is not displaced.      Pulses: Intact distal pulses.     Heart sounds: Heart sounds are distant. Murmur (Soft SEM at RUSB.) heard.    No friction rub. Gallop present. S4 sounds present.     Comments: Normal S1 and split S2. Pulmonary:     Effort: Pulmonary effort is normal. No respiratory distress.     Breath sounds: No wheezing, rhonchi or rales.  Chest:     Chest wall: Tenderness present.  Abdominal:     General: Abdomen is flat. Bowel sounds are normal. There is no distension.     Palpations: Abdomen is soft. There is no mass (No HSM or bruit).  Musculoskeletal:        General: Swelling (Trivial ankle swelling.  Diffuse spider veins of mild varicose veins.) present.     Cervical back: Normal range of motion and neck supple.  Skin:    General: Skin is warm and dry.     Coloration: Skin is not jaundiced or pale.     Comments: No new stasis changes.  Neurological:     General: No focal deficit present.     Mental Status: She is alert and oriented to person, place, and time.     Gait: Gait abnormal.  Psychiatric:        Mood and Affect: Mood normal.        Behavior: Behavior normal.        Thought Content: Thought content normal.        Judgment: Judgment normal.     Adult ECG Report  Rate: 62;  Rhythm: normal sinus rhythm and - Cannot exclude anterior Mikan age-indeterminate.  Likely normal.  Otherwise normal axis, intervals and durations. ;   Narrative Interpretation:  Borderline  Recent Labs: All available labs reviewed.  Pre--CTA labs checked today. No results found for: CHOL, HDL, LDLCALC, LDLDIRECT, TRIG, CHOLHDL Lab Results  Component Value Date   CREATININE 0.66 03/17/2021   BUN 14 03/17/2021   NA 141 03/17/2021   K 4.5 03/17/2021   CL 100 03/17/2021   CO2 25 03/17/2021   CBC Latest Ref Rng & Units 03/17/2021 02/18/2021 03/03/2016  WBC 3.4 - 10.8 x10E3/uL 6.6 12.4(H) 6.9  Hemoglobin 11.1 - 15.9 g/dL 13.7 14.2 13.4  Hematocrit 34.0 - 46.6 % 40.8 41.2 37.8  Platelets 150 - 450  x10E3/uL 264 264 261    No results found for: HGBA1C Lab Results  Component Value Date   TSH 1.870 03/17/2021    ==================================================  COVID-19 Education: The signs and symptoms of COVID-19 were discussed with the patient and how to seek care for testing (follow up with PCP or arrange E-visit).    I spent a total of 44 minutes with the patient spent in direct patient consultation.  Additional time spent with chart review  / charting (studies, outside notes, etc): 25 min Total Time: 69 min  Current medicines are reviewed at length with the patient today.  (+/- concerns) continue Rivaroxaban.  Refilled.  She asked about gabapentin.  Recommend she discuss this with PCP.  This visit occurred during the SARS-CoV-2 public health emergency.  Safety protocols were in place, including screening questions prior to the visit, additional usage of staff PPE, and extensive cleaning of exam room while observing appropriate contact time as indicated for  disinfecting solutions.  Notice: This dictation was prepared with Dragon dictation along with smart phrase technology. Any transcriptional errors that result from this process are unintentional and may not be corrected upon review.   Studies Ordered:  Orders Placed This Encounter  Procedures   CT CORONARY MORPH W/CTA COR W/SCORE W/CA W/CM &/OR WO/CM   CBC   Comp Met (CMET)   TSH   EKG 12-Lead   ECHOCARDIOGRAM COMPLETE   VAS [place] LOWER EXTREMITY VENOUS (DVT)    Patient Instructions / Medication Changes & Studies & Tests Ordered   Patient Instructions  Medication Instructions:  Your physician recommends that you continue on your current medications as directed. Please refer to the Current Medication list given to you today.   *If you need a refill on your cardiac medications before your next appointment, please call your pharmacy*   Lab Work:  Your provider has ordered labs to be drawn today (TSH, CBC, CMP).  These will be drawn before you leave the office.   If you have labs (blood work) drawn today and your tests are completely normal, you will receive your results only by: [place] (if you have MyChart) OR A paper copy in the mail If you have any lab test that is abnormal or we need to change your treatment, we will call you to review the results.   Testing/Procedures:  Echocardiogram - Your physician has requested that you have an echocardiogram. Echocardiography is a painless test that uses sound waves to create images of your heart. It provides your doctor with information about the size and shape of your heart and how well your hearts chambers and valves are working. This procedure takes approximately one hour. There are no restrictions for this procedure.   2. Your provider has ordered a lower extremity doppler of your legs to check for DVT. You will come to our office to have this completed. There is no prep necessary for this. Plan to be in our office for approximately 90 minutes.   3. Your cardiac CT is February 2nd, 2023 at 8 am  [facility] [address] [place], [place] [address] [phone]  Please arrive 15 mins early for check-in and test prep.    Please follow these instructions carefully (unless otherwise directed):   On the Day of the Test: Drink plenty of water until 1 hour prior to the test. Do not eat any food 4 hours prior to the test. You may take your regular medications prior to the test.  Take metoprolol (Lopressor) two hours prior to test. FEMALES- please wear underwire-free bra if available, avoid dresses & tight clothing        After the Test: Drink plenty of water. After receiving IV contrast, you may experience a mild flushed feeling. This is normal. On occasion, you may experience a mild rash up to 24 hours after the test. This is not dangerous. If this occurs, you can take Benadryl 25 mg and  increase your fluid intake. If you experience trouble breathing, this can be serious. If it is severe call 911 IMMEDIATELY. If it is mild, please call our office. If you take any of these medications: Glipizide/Metformin, Avandament, Glucavance, please do not take 48 hours after completing test unless otherwise instructed.  Please allow 2-4 weeks for scheduling of routine cardiac CTs. Some insurance companies require a pre-authorization which may delay scheduling of this test.   For non-scheduling related questions, please contact the cardiac imaging  nurse navigator should you have any questions/concerns: Marchia Bond, Cardiac Imaging Nurse Navigator Gordy Clement, Cardiac Imaging Nurse Navigator [facility] Direct Office Dial: [phone]   For scheduling needs, including cancellations and rescheduling, please call [place], [phone].    Follow-Up: At [facility], you and your health needs are our priority.  As part of our continuing mission to provide you with exceptional heart care, we have created designated Provider Care Teams.  These Care Teams include your primary Cardiologist (physician) and Advanced Practice Providers (APPs -  Physician Assistants and Nurse Practitioners) who all work together to provide you with the care you need, when you need it.  We recommend signing up for the patient portal called "MyChart".  Sign up information is provided on this After Visit Summary.  MyChart is used to connect with patients for Virtual Visits (Telemedicine).  Patients are able to view lab/test results, encounter notes, upcoming appointments, etc.  Non-urgent messages can be sent to your provider as well.   To learn more about what you can do with MyChart, go to [email or web address].    Your next appointment:   3 week(s)  The format for your next appointment:   In Person  Provider:   You may see Glenetta Hew, MD or one of the following Advanced  Practice Providers on your designated Care Team:   Murray Hodgkins, NP Christell Faith, PA-C Cadence Kathlen Mody, PA-C:1}    Other Instructions N/A    Glenetta Hew, M.D., M.S. Interventional Cardiologist   Pager # [phone] Phone # [phone] [address] [place], [place] [address]   Thank you for choosing Heartcare in [place]!!

## 2021-03-18 LAB — CBC
Hematocrit: 40.8 % (ref 34.0–46.6)
Hemoglobin: 13.7 g/dL (ref 11.1–15.9)
MCH: 27.6 pg (ref 26.6–33.0)
MCHC: 33.6 g/dL (ref 31.5–35.7)
MCV: 82 fL (ref 79–97)
Platelets: 264 10*3/uL (ref 150–450)
RBC: 4.96 x10E6/uL (ref 3.77–5.28)
RDW: 13.4 % (ref 11.7–15.4)
WBC: 6.6 10*3/uL (ref 3.4–10.8)

## 2021-03-18 LAB — COMPREHENSIVE METABOLIC PANEL
ALT: 22 IU/L (ref 0–32)
AST: 24 IU/L (ref 0–40)
Albumin/Globulin Ratio: 1.7 (ref 1.2–2.2)
Albumin: 4.6 g/dL (ref 3.7–4.7)
Alkaline Phosphatase: 129 IU/L — ABNORMAL HIGH (ref 44–121)
BUN/Creatinine Ratio: 21 (ref 12–28)
BUN: 14 mg/dL (ref 8–27)
Bilirubin Total: 0.3 mg/dL (ref 0.0–1.2)
CO2: 25 mmol/L (ref 20–29)
Calcium: 9.5 mg/dL (ref 8.7–10.3)
Chloride: 100 mmol/L (ref 96–106)
Creatinine, Ser: 0.66 mg/dL (ref 0.57–1.00)
Globulin, Total: 2.7 g/dL (ref 1.5–4.5)
Glucose: 127 mg/dL — ABNORMAL HIGH (ref 70–99)
Potassium: 4.5 mmol/L (ref 3.5–5.2)
Sodium: 141 mmol/L (ref 134–144)
Total Protein: 7.3 g/dL (ref 6.0–8.5)
eGFR: 94 mL/min/{1.73_m2} (ref >59)

## 2021-03-18 LAB — TSH: TSH: 1.87 u[IU]/mL (ref 0.450–4.500)

## 2021-03-19 ENCOUNTER — Encounter: Payer: Self-pay | Admitting: Cardiology

## 2021-03-19 NOTE — Assessment & Plan Note (Signed)
I did not see that she had any evaluation of her lower extremities with her spider veins/varicose veins and edema.  She does have symptoms to suggest stasis insufficiency and help was told to wear support stockings.  She did seem familiar with this.  Plan: Foot elevation and support stockings  Lower extremity venous Dopplers to evaluate for DVT based on unilateral pain but also to evaluate for reflux

## 2021-03-19 NOTE — Assessment & Plan Note (Signed)
Not commented on as far as I can tell by echocardiogram done for stress echo.  I have not seen an echocardiogram here in the states besides a stress echo. She probably in the past had at least some right-sided failure that may or may not have improved.  EF was previously documented to be 40%  Plan: Check 2D echo.  It is possible that A. fib could have been triggered by the ASD repair.  Usually less atrial flutter, this was clearly A. fib.

## 2021-03-19 NOTE — Assessment & Plan Note (Signed)
With known hypothyroidism on Synthroid, need to ensure thyroid levels are stable.

## 2021-03-19 NOTE — Assessment & Plan Note (Signed)
Chest tightness that is associated with new onset of A. fib and documented cardiomyopathy.  Plan: Ischemic evaluation with Coronary CTA.  CT FFR if needed.  Will check pre-CT labs.

## 2021-03-19 NOTE — Assessment & Plan Note (Signed)
Not currently complaining of exertional dyspnea but has had that in the past.  She is not very active now and probably may not be stressing as of now.  Plan: Check 2D echocardiogram and Coronary CTA

## 2021-03-19 NOTE — Assessment & Plan Note (Signed)
Prior history of ASD repair.  Had an echocardiogram not that long ago that was a stress echo estimated EF 40% improved to 45% with exertion.  Has been lost to follow-up since.  Thankfully, she is not really complaining of any heart failure symptoms besides the swelling which actually is probably more from venous stasis/venous insufficiency.  Plan:   Check 2D echocardiogram  Continue Toprol for now.  Low threshold to add ARB in follow-up.  NYHA class I symptoms; euvolemic.  No diuretic requirement.

## 2021-03-19 NOTE — Assessment & Plan Note (Addendum)
New onset A. fib in a patient who presented with abdominal pain.  Quite likely would have the abdominal pain was was a triggering event.  She does not adequately hydrate.  She does have risk for arrhythmia based on ASD repair and likely atrial enlargement.  She also had moderate reduced EF by echo couple years ago.  Has not been reassessed since then.  He has only been on Toprol. Unusual chest discomfort symptoms that are somewhat bothersome in a patient with new onset A. Fib.  This patients CHA2DS2-VASc Score and unadjusted Ischemic Stroke Rate (% per year) is equal to 7.2 % stroke rate/year from a score of 5  Above score calculated as 1 point each if present [CHF, HTN, DM,Vascular=MI/PAD/ Aortic Plaque, Age if 65-74, or Female]; 2 points each if present [Age > 75, or Stroke/TIA/TE]   Plan:  Refill Xarelto  2D echocardiogram  Ischemic evaluation with Coronary CTA  Check TSH and CBC along with CMP.  We will also check an event monitor for 214-day Zio patch is-she would like to wait until she returns from [place] (they are open to fly out on February 5-we can order when she returns in follow-up after her initial studies)

## 2021-03-19 NOTE — Assessment & Plan Note (Signed)
Aortic atherosclerosis seen on CT scan.  Will initiate cardiovascular risk factor modification.  He is currently on beta-blocker.  Not currently on aspirin because of Xarelto.  Rechecking coronary CT angiogram to his evaluate extent of CAD.  Would likely just lipid management based on these results.  Also need to ensure adequate glycemic and blood pressure control.Kristi Conrad

## 2021-03-29 ENCOUNTER — Ambulatory Visit (INDEPENDENT_AMBULATORY_CARE_PROVIDER_SITE_OTHER): Payer: Medicare Other

## 2021-03-29 ENCOUNTER — Other Ambulatory Visit: Payer: Self-pay

## 2021-03-29 DIAGNOSIS — R0789 Other chest pain: Secondary | ICD-10-CM

## 2021-03-29 DIAGNOSIS — I420 Dilated cardiomyopathy: Secondary | ICD-10-CM

## 2021-03-29 LAB — ECHOCARDIOGRAM COMPLETE
AR max vel: 3 cm2
AV Area VTI: 2.82 cm2
AV Area mean vel: 2.69 cm2
AV Mean grad: 4 mmHg
AV Peak grad: 6.1 mmHg
Ao pk vel: 1.23 m/s
Area-P 1/2: 3.06 cm2
Calc EF: 54.4 %
S' Lateral: 3.7 cm
Single Plane A2C EF: 54.1 %
Single Plane A4C EF: 53.9 %

## 2021-04-01 ENCOUNTER — Telehealth: Payer: Self-pay | Admitting: *Deleted

## 2021-04-01 NOTE — Telephone Encounter (Signed)
Attempted to call pt using [place] language line services [phone].  No answer. No voicemail set up.  Will try to reach pt at a later time.  Pt does have f/u scheduled 04/07/21 with Dr. Herbie [place].

## 2021-04-01 NOTE — Telephone Encounter (Signed)
-----   Message from Marykay Lex, MD sent at 03/29/2021  6:18 PM EST ----- Echocardiogram shows low normal pump function with an ejection fraction of 50 to 55%.  Normal range is 50 to 65%. The heart does show signs of abnormal relaxation having grade 2 diastolic dysfunction -> however both atria are relatively normal in size which indicates no real evidence of diastolic heart failure.  Somewhat difficult study to read, but seems to be relatively normal.  Bryan Lemma, MD  NEEDS Spanish interpretation

## 2021-04-06 ENCOUNTER — Telehealth (HOSPITAL_COMMUNITY): Payer: Self-pay | Admitting: *Deleted

## 2021-04-06 NOTE — Telephone Encounter (Addendum)
Reaching out to patient, with a Spanish interpreter (Amy, ID # [id number]), to offer assistance regarding upcoming cardiac imaging study; pt verbalizes understanding of appt date/time, parking situation and where to check in, pre-test NPO status and verified current allergies; name and call back number provided for further questions should they arise  Larey Brick RN Navigator Cardiac Imaging [facility] [phone] office [phone] cell  Patient unsure if HR and based upon most recent EKG, pt's HR is 62. Patient to take her daily 25mg  metoprolol succinate two hours prior to her cardiac CT scan.

## 2021-04-07 ENCOUNTER — Ambulatory Visit
Admission: RE | Admit: 2021-04-07 | Discharge: 2021-04-07 | Disposition: A | Discharge status: Home / Self Care | Payer: Medicare Other | Source: Ambulatory Visit | Attending: Cardiology | Admitting: Cardiology

## 2021-04-07 ENCOUNTER — Other Ambulatory Visit: Payer: Self-pay

## 2021-04-07 ENCOUNTER — Ambulatory Visit: Payer: Medicare Other | Admitting: Cardiology

## 2021-04-07 DIAGNOSIS — R072 Precordial pain: Secondary | ICD-10-CM

## 2021-04-07 MED ORDER — NITROGLYCERIN 0.4 MG SL SUBL
0.8000 mg | SUBLINGUAL_TABLET | Freq: Once | SUBLINGUAL | Status: AC
  Administered 2021-04-07: 0.8 mg via SUBLINGUAL

## 2021-04-07 MED ORDER — IOHEXOL 350 MG/ML SOLN
75.0000 mL | Freq: Once | INTRAVENOUS | Status: AC | PRN
  Administered 2021-04-07: 75 mL via INTRAVENOUS

## 2021-04-07 NOTE — Progress Notes (Signed)
Patient tolerated procedure well. Ipad interpreter used for translation. Daughter-in-law at side speaks English also, Ambulate w/o difficulty. Denies light headedness or being dizzy. Sitting in chair drinking water provided. Encouraged to drink extra water today and reasoning explained. Verbalized understanding. All questions answered. ABC intact. No further needs. Discharge from procedure area w/o issues.

## 2021-04-08 ENCOUNTER — Telehealth: Payer: Self-pay | Admitting: Emergency Medicine

## 2021-04-08 NOTE — Telephone Encounter (Signed)
Results gone over with results of coronary CTA. See results encounter from 2/3.   Closing this encounter.

## 2021-04-08 NOTE — Telephone Encounter (Signed)
-----   Message from Leonie Man, MD sent at 04/08/2021  1:47 PM EST ----- Coronary CTA angiogram result:  Coronary artery calcium score is 0.  Very low risk.  Angiogram showed no significant plaque.  This is great news.  The atrial septal occluder is in place and seems to be working fine with no leak.  Noncardiac findings reveal a small 5 mm nodule in the right lower lobe. Will forward to PCP, may want to consider follow-up evaluation  Glenetta Hew, MD

## 2021-04-08 NOTE — Telephone Encounter (Signed)
Patient called with assistance of language line, [phone] Kristi Conrad  Results gone over with patient. Pt verbalized understanding. Questions (if any) were answered.   Patient voiced appreciation for the call and understands that they can reach out to our office with any questions or concerns.

## 2021-04-11 NOTE — Progress Notes (Signed)
Cardiology Office Note    Date:  04/12/2021   ID:  Kristi Conrad, [place] Jul 27, 1950, MRN [id number]  PCP:  Freddy Finner, NP  Cardiologist:  Glenetta Hew, MD  Electrophysiologist:  None   Chief Complaint: Follow up  History of Present Illness:   Kristi Conrad is a 71 y.o. female with history of PAF on Xarelto, dilated cardiomyopathy with a prior EF of 40%, remote ASD repair > 30 years ago in [place], aortic atherosclerosis, HLD, hypothyroidism, varicose veins, and chronic low back pain with polyneuropathy who presents for follow up of echo and coronary CTA.   She was previously followed by Dr. Saralyn Pilar, though more recently has established care with Dr. Ellyn Hack on 03/17/2021.   Prior nuclear stress test in 2016 at [facility] showed no evidence of ischemia or scar with an EF of 59%.  No significant coronary artery calcifications and a prior ASD repair were noted.  Echo at that time showed a preserved LVSF with diastolic dysfunction, and mild mitral regurgitation.   Stress echo in 2018, performed at [facility], showed a resting EF of 40% with diffuse hypokinesis with stress images showing an EF of 45% with diffuse hypokinesis.   She was seen in the [facility] ED in 02/2021 with abdominal pain and was noted to be in Afib with RVR.  CT of the abdomen/pelvis showed no acute findings.  High sensitivity troponin negative.  She was started on Xarelto and PTA metoprolol was continued.  She was treated for gastritis.   She established with Dr. Ellyn Hack on 03/17/2021 and noted intermittent brief episodes of palpitations and was without chest pain. EKG demonstrated sinus rhythm.  Lower extremity ultrasound on 03/17/2021 was negative for DVT bilaterally.  Echo on 03/29/2021 showed an EF of 50-55%, no RWMA, 0000000, RV systolic function and ventricular cavity size normal, mildly elevated PASP estimated at 36.8 mmHg, mild mitral regurgitation, mild to moderate tricuspid regurgitation, and an  estimated right atrial pressure of 3 mmHg.  Coronary CTA on 04/07/2021 showed a calcium score of 0 and no evidence of CAD.  Atrial septal occluder device was noted and without leak.  Noncardiac overread showed a small hiatal hernia along with a small solid pulmonary nodule of the right lower lobe measuring 5 mm.   She comes in doing well from a cardiac perspective and is without symptoms of angina or decompensation.  She is tolerating metoprolol and Xarelto without issues.  No dyspnea, palpitations, lower extremity swelling or orthopnea.  No dizziness, presyncope, or syncope.  No falls, hematochezia, or melena.  She will be leaving for [place] next week and will be returning in April.  She does not have any active cardiac issues or concerns at this time.   Labs independently reviewed: 03/2021 - TSH normal, BUN 14, serum creatinine 0.66, potassium 4.5, albumin 4.6, AST/ALT normal, Hgb 13.7, PLT 264, magnesium 2.1  Past Medical History:  Diagnosis Date   Acquired hypothyroidism    Atrial fibrillation ([facility]) 02/2021   Cardiomyopathy ([employer]) 01/2017   Echo showed mildly reduced EF of roughly 40%-increase to 45% with exercise.   H/O congenital atrial septal defect (ASD) repair 1984   In [place]   Hypertension    Positive ANA (antinuclear antibody)    Has polyneuropathy   Varicose veins of both lower extremities    Actually seen by [facility]-November 2022    Past Surgical History:  Procedure Laterality Date   ABDOMINAL HYSTERECTOMY  ASD REPAIR  1984   Unknown details.  Was performed while living in [place].   CHOLECYSTECTOMY     COLONOSCOPY WITH PROPOFOL N/A 11/30/2017   Procedure: COLONOSCOPY WITH PROPOFOL;  Surgeon: Virgel Manifold, MD;  Location: [facility];  Service: Endoscopy;  Laterality: N/A;   COLONOSCOPY WITH PROPOFOL N/A 04/14/2019   Procedure: COLONOSCOPY WITH PROPOFOL;  Surgeon: Lin Landsman, MD;  Location: [place];  Service: Endoscopy;   Laterality: N/A;   ESOPHAGOGASTRODUODENOSCOPY (EGD) WITH PROPOFOL N/A 11/30/2017   Procedure: ESOPHAGOGASTRODUODENOSCOPY (EGD) WITH PROPOFOL;  Surgeon: Virgel Manifold, MD;  Location: [facility];  Service: Endoscopy;  Laterality: N/A;   ESOPHAGOGASTRODUODENOSCOPY (EGD) WITH PROPOFOL N/A 04/14/2019   Procedure: ESOPHAGOGASTRODUODENOSCOPY (EGD) WITH PROPOFOL;  Surgeon: Lin Landsman, MD;  Location: [place];  Service: Endoscopy;  Laterality: N/A;   NM MYOVIEW LTD Bilateral 2016   [facility]: Normal LV function.  No ischemia.   Stress Echocardiogram  01/2017   ([facility]) mildly reduced LV function.  EF 40%.  Improved to 45% with exercise.    Current Medications: Current Meds  Medication Sig   DULoxetine (CYMBALTA) 30 MG capsule daily as needed.   levothyroxine (SYNTHROID, LEVOTHROID) 50 MCG tablet Take 50 mcg by mouth daily before breakfast.   metoprolol succinate (TOPROL-XL) 25 MG 24 hr tablet Take 1 tablet (25 mg total) by mouth in the morning and at bedtime.   omeprazole (PRILOSEC) 20 MG capsule Take 1 capsule (20 mg total) by mouth 2 (two) times daily before a meal.   rivaroxaban (XARELTO) 20 MG TABS tablet Take 1 tablet (20 mg total) by mouth daily with supper.   sitaGLIPtin (JANUVIA) 50 MG tablet Take 50 mg by mouth daily.    Allergies:   Patient has no known allergies.   Social History   Socioeconomic History   Marital status: Married    Spouse name: Not on file   Number of children: Not on file   Years of education: Not on file   Highest education level: Not on file  Occupational History   Not on file  Tobacco Use   Smoking status: Never   Smokeless tobacco: Never  Vaping Use   Vaping Use: Never used  Substance and Sexual Activity   Alcohol use: Not Currently   Drug use: Never   Sexual activity: Not on file  Other Topics Concern   Not on file  Social History Narrative   She and her husband are originally from [place].  They do not speak  [place].      Due to language barrier history, not able to obtain much information.      She has less than high school graduate level education.   Social Determinants of Health   Financial Resource Strain: Not on file  Food Insecurity: Not on file  Transportation Needs: Not on file  Physical Activity: Not on file  Stress: Not on file  Social Connections: Not on file     Family History:  The patient's family history includes Stroke in her mother. There is no history of Breast cancer.  ROS:   Review of Systems  Constitutional:  Negative for chills, diaphoresis, fever and weight loss.  HENT:  Negative for congestion.   Eyes:  Negative for discharge and redness.  Respiratory:  Negative for cough, sputum production, shortness of breath and wheezing.   Cardiovascular:  Negative for chest pain, palpitations, orthopnea, claudication, leg swelling and PND.  Gastrointestinal:  Negative for abdominal pain, blood  in stool, heartburn, melena, nausea and vomiting.  Genitourinary:  Negative for dysuria, flank pain, frequency, hematuria and urgency.  Musculoskeletal:  Positive for back pain. Negative for falls and myalgias.  Skin:  Negative for rash.  Neurological:  Negative for dizziness, tingling, tremors, sensory change, speech change, focal weakness, loss of consciousness and weakness.  Endo/Heme/Allergies:  Does not bruise/bleed easily.  Psychiatric/Behavioral:  Negative for substance abuse. The patient is not nervous/anxious.   All other systems reviewed and are negative.   EKGs/Labs/Other Studies Reviewed:    Studies reviewed were summarized above. The additional studies were reviewed today:  Coronary CTA 04/07/2021: FINDINGS: Aorta: Normal size. Mild aortic root and descending aorta calcifications. No dissection.   Aortic Valve:  Trileaflet.  No calcifications.   Coronary Arteries:  Normal coronary origin.  Right dominance.   RCA is a dominant artery that gives rise to PDA and  PLA. There is no plaque.   Left main is a large artery that gives rise to LAD and LCX arteries. LM has no disease.   LAD has no plaque.   LCX is a non-dominant artery that gives rise to four obtuse marginal branches. There is no plaque.   OTHER FINDINGS: OTHER FINDINGS Atrial septal occluder device noted. No evidence of atrial septal leak.   Normal pulmonary vein drainage into the left atrium.   Normal left atrial appendage without a thrombus.   Normal size of the pulmonary artery.   IMPRESSION: 1. Normal coronary calcium score of 0. Patient is low risk for coronary events. 2. Normal coronary origin with right dominance. 3. No evidence of CAD. 4. CAD-RADS 0. Consider non-atherosclerotic causes of chest pain. 5. Atrial septal occluder device noted. No evidence of atrial septal defect or leak. __________  2D echo 03/29/2021: 1. Left ventricular ejection fraction, by estimation, is 50 to 55%. The  left ventricle has low normal function. The left ventricle has no regional  wall motion abnormalities. Left ventricular diastolic parameters are  consistent with Grade II diastolic  dysfunction (pseudonormalization).   2. Right ventricular systolic function is normal. The right ventricular  size is normal. There is mildly elevated pulmonary artery systolic  pressure. The estimated right ventricular systolic pressure is [id number] mmHg.   3. The mitral valve is normal in structure. Mild mitral valve  regurgitation. No evidence of mitral stenosis.   4. Tricuspid valve regurgitation is mild to moderate.   5. The aortic valve is normal in structure. Aortic valve regurgitation is  mild. No aortic stenosis is present.   6. The inferior vena cava is normal in size with greater than 50%  respiratory variability, suggesting right atrial pressure of 3 mmHg. __________  Stress echo 01/18/2017 ([facility]):INTERPRETATION  ABNORMAL STRESS ECHOCARDIOGRAM ABNORMAL RESTING STUDY, IMPROVEMENT WITH  STRESS  NORMAL RIGHT VENTRICULAR SYSTOLIC FUNCTION  MILD VALVULAR REGURGITATION (See above)  NO VALVULAR STENOSIS NOTED  MILD LV ENLARGEMENT  MILD RV ENLARGEMENT  __________  Nuclear stress test 04/13/2014 [facility]): Impressions:  - Normal myocardial perfusion study  - No evidence for significant ischemia or scar is noted.  - Post stress: Global systolic function is normal. The ejection fraction  calculated at 59%.  - No significant coronary calcifications were noted on the attenuation CT  - Attenuation CT scan shows post ASD repair findings __________  2D echo 04/13/2014 [facility]): Normal  left ventricular  ejection fraction  (123456)  Diastolic  left ventricular  dysfunction  Elevated  left ventricular  filling pressures  Degenerative  mitral valve  disease  Mitral  valve prolapse  Mitral  regurgitation (trivial)  Dilated  left atrium  Normal  right ventricular  contractile performance  Tricuspid  regurgitation  (trivial)    EKG:  EKG is ordered today.  The EKG ordered today demonstrates sinus bradycardia with sinus arrhythmia, 55 bpm, no acute ST-T changes  Recent Labs: 03/17/2021: ALT 22; BUN 14; Creatinine, Ser 0.66; Hemoglobin 13.7; Platelets 264; Potassium 4.5; Sodium 141; TSH 1.870  Recent Lipid Panel No results found for: CHOL, TRIG, HDL, CHOLHDL, VLDL, LDLCALC, LDLDIRECT  PHYSICAL EXAM:    VS:  BP (!) 130/56 (BP Location: Left Arm, Patient Position: Sitting, Cuff Size: Large)    Pulse (!) 55    Ht 5' (1.524 m)    Wt 150 lb (68 kg)    SpO2 98%    BMI 29.29 kg/m   BMI: Body mass index is 29.29 kg/m.  Physical Exam Vitals reviewed.  Constitutional:      Appearance: She is well-developed.  HENT:     Head: Normocephalic and atraumatic.  Eyes:     General:        Right eye: No discharge.        Left eye: No discharge.  Neck:     Vascular: No JVD.  Cardiovascular:     Rate and Rhythm: Regular rhythm. Bradycardia present.     Pulses:          Posterior tibial pulses are  2+ on the right side and 2+ on the left side.     Heart sounds: S1 normal and S2 normal. Heart sounds not distant. No midsystolic click and no opening snap. Murmur heard.  Systolic murmur is present with a grade of 1/6 at the upper right sternal border.    No friction rub.  Pulmonary:     Effort: Pulmonary effort is normal. No respiratory distress.     Breath sounds: Normal breath sounds. No decreased breath sounds, wheezing or rales.  Chest:     Chest wall: No tenderness.  Abdominal:     General: There is no distension.     Palpations: Abdomen is soft.     Tenderness: There is no abdominal tenderness.  Musculoskeletal:     Cervical back: Normal range of motion.     Right lower leg: No edema.     Left lower leg: No edema.  Skin:    General: Skin is warm and dry.     Nails: There is no clubbing.  Neurological:     Mental Status: She is alert and oriented to person, place, and time.  Psychiatric:        Speech: Speech normal.        Behavior: Behavior normal.        Thought Content: Thought content normal.        Judgment: Judgment normal.    Wt Readings from Last 3 Encounters:  04/12/21 150 lb (68 kg)  03/17/21 150 lb 2 oz (68.1 kg)  02/18/21 149 lb (67.6 kg)     ASSESSMENT & PLAN:   PAF: Maintaining sinus rhythm with a mildly bradycardic rate.  Possibly in the setting of her episode of abdominal pain or exacerbated by prior ASD.  Continue Toprol XL.  CHADS2VASc at least 5 (CHF, HTN, age x 1, vascular disease, sex category).  She remains on Xarelto and is without symptoms concerning for bleeding.  Recent CBC normal.  Dilated cardiomyopathy: She appears euvolemic and well compensated.  NYHA class I symptoms.  Prior EF noted to be 40% with most recent echo performed in 03/2021 with a low normal LVSF with an EF of 55%.  Continue Toprol XL.  Not requiring a standing diuretic.  Initially our plans were to decrease metoprolol followed by possible addition of ARB, however she informed  she will be leaving the country next week and will not return until April.  Given we would not be able to have close follow-up or follow-up labs we defer to these changes at this time and will look to initiate them when she returns.  She will need updated labs when she returns to the country.  History of ASD repair: No device leak on recent imaging.   Aortic atherosclerosis/HLD: No coronary artery calcification noted on recent coronary CTA.  No recent LDL available for review.  Not currently on a statin.  Recommend obtaining fasting lipid panel when she returns from her trip to [place].  Hypothyroidism: TSH normal.  She remains on replacement therapy with levothyroxine.  Follow up with PCP.   Pulmonary nodule: She has been advised to follow up with her PCP.  Language barrier: [place] medical interpreter was utilized for the visit.    Disposition: F/u with Dr. Ellyn [facility] or an APP in 3-4 months (once back from [place]).   Medication Adjustments/Labs and Tests Ordered: Current medicines are reviewed at length with the patient today.  Concerns regarding medicines are outlined above. Medication changes, Labs and Tests ordered today are summarized above and listed in the Patient Instructions accessible in Encounters.   Signed, Christell Faith, PA-C 04/12/2021 3:01 PM     [place] [address] [place], [place] [address] [phone]

## 2021-04-12 ENCOUNTER — Encounter: Payer: Self-pay | Admitting: Physician Assistant

## 2021-04-12 ENCOUNTER — Ambulatory Visit (INDEPENDENT_AMBULATORY_CARE_PROVIDER_SITE_OTHER): Payer: Medicare Other | Admitting: Physician Assistant

## 2021-04-12 ENCOUNTER — Other Ambulatory Visit: Payer: Self-pay

## 2021-04-12 VITALS — BP 130/56 | HR 55 | Ht 60.0 in | Wt 150.0 lb

## 2021-04-12 DIAGNOSIS — I420 Dilated cardiomyopathy: Secondary | ICD-10-CM | POA: Diagnosis not present

## 2021-04-12 DIAGNOSIS — I480 Paroxysmal atrial fibrillation: Secondary | ICD-10-CM | POA: Diagnosis not present

## 2021-04-12 DIAGNOSIS — E782 Mixed hyperlipidemia: Secondary | ICD-10-CM

## 2021-04-12 DIAGNOSIS — I700 Atherosclerosis of aorta: Secondary | ICD-10-CM | POA: Diagnosis not present

## 2021-04-12 DIAGNOSIS — Z8774 Personal history of (corrected) congenital malformations of heart and circulatory system: Secondary | ICD-10-CM

## 2021-04-12 DIAGNOSIS — E039 Hypothyroidism, unspecified: Secondary | ICD-10-CM

## 2021-04-12 DIAGNOSIS — Z789 Other specified health status: Secondary | ICD-10-CM

## 2021-04-12 DIAGNOSIS — R911 Solitary pulmonary nodule: Secondary | ICD-10-CM

## 2021-04-12 NOTE — Patient Instructions (Signed)
Medication Instructions:  No changes at this time.  *If you need a refill on your cardiac medications before your next appointment, please call your pharmacy*   Lab Work: None  If you have labs (blood work) drawn today and your tests are completely normal, you will receive your results only by: MyChart Message (if you have MyChart) OR A paper copy in the mail If you have any lab test that is abnormal or we need to change your treatment, we will call you to review the results.   Testing/Procedures: None   Follow-Up: At [facility], you and your health needs are our priority.  As part of our continuing mission to provide you with exceptional heart care, we have created designated Provider Care Teams.  These Care Teams include your primary Cardiologist (physician) and Advanced Practice Providers (APPs -  Physician Assistants and Nurse Practitioners) who all work together to provide you with the care you need, when you need it.   Your next appointment:   4 month(s)  The format for your next appointment:   In Person  Provider:   Bryan Lemma, MD or Eula Listen, PA-C

## 2021-04-13 ENCOUNTER — Telehealth: Payer: Self-pay | Admitting: Cardiology

## 2021-04-13 NOTE — Telephone Encounter (Signed)
metoprolol succinate (TOPROL-XL) 25 MG 24 hr tablet 180 tablet 3 03/17/2021 04/16/2021   Sig - Route: Take 1 tablet (25 mg total) by mouth in the morning and at bedtime. - Oral   Sent to pharmacy as: metoprolol succinate (TOPROL-XL) 25 MG 24 hr tablet   E-Prescribing Status: Receipt confirmed by pharmacy (03/17/2021 11:21 AM EST)    Pharmacy  [facility] - [place], [place] - [address]

## 2021-04-13 NOTE — Telephone Encounter (Signed)
°*  STAT* If patient is at the pharmacy, call can be transferred to refill team.   1. Which medications need to be refilled? (please list name of each medication and dose if known) Metoprolol succinate 25 mg   2. Which pharmacy/location (including street and city if local pharmacy) is medication to be sent to? [facility]  3. Do they need a 30 day or 90 day supply? 90 day

## 2021-04-21 ENCOUNTER — Ambulatory Visit: Payer: Medicare Other | Admitting: Cardiology

## 2021-07-10 NOTE — Progress Notes (Deleted)
Cardiology Office Note    Date:  07/10/2021   ID:  Kristi Conrad, [place] 05-23-1950, MRN [id number]  PCP:  Sandrea Hughs, NP  Cardiologist:  Bryan Lemma, MD  Electrophysiologist:  None   Chief Complaint: ***  History of Present Illness:   Kristi Conrad is a 71 y.o. female with history of PAF on Xarelto, dilated cardiomyopathy with a prior EF of 40%, remote ASD repair > 30 years ago in [place], aortic atherosclerosis, HLD, hypothyroidism, varicose veins, and chronic low back pain with polyneuropathy who presents for follow up of ***.    She was previously followed by Dr. Darrold Junker, though more recently has established care with Dr. Herbie Baltimore on 03/17/2021.    Prior nuclear stress test in 2016 at [facility] showed no evidence of ischemia or scar with an EF of 59%.  No significant coronary artery calcifications and a prior ASD repair were noted.  Echo at that time showed a preserved LVSF with diastolic dysfunction, and mild mitral regurgitation.    Stress echo in 2018, performed at [facility], showed a resting EF of 40% with diffuse hypokinesis with stress images showing an EF of 45% with diffuse hypokinesis.    She was seen in the [facility] ED in 02/2021 with abdominal pain and was noted to be in Afib with RVR.  CT of the abdomen/pelvis showed no acute findings.  High sensitivity troponin negative.  She was started on Xarelto and PTA metoprolol was continued.  She was treated for gastritis.    She established with Dr. Herbie Baltimore on 03/17/2021 and noted intermittent brief episodes of palpitations and was without chest pain. EKG demonstrated sinus rhythm.  Lower extremity ultrasound on 03/17/2021 was negative for DVT bilaterally.  Echo on 03/29/2021 showed an EF of 50-55%, no RWMA, Gr2DD, RV systolic function and ventricular cavity size normal, mildly elevated PASP estimated at 36.8 mmHg, mild mitral regurgitation, mild to moderate tricuspid regurgitation, and an estimated right atrial  pressure of 3 mmHg.  Coronary CTA on 04/07/2021 showed a calcium score of 0 and no evidence of CAD.  Atrial septal occluder device was noted and without leak.  Noncardiac overread showed a small hiatal hernia along with a small solid pulmonary nodule of the right lower lobe measuring 5 mm.  She was last seen in the office in 04/2021 and was without symptoms of angina or decompensation.  Escalation of GDMT was deferred given her upcoming trip to [place] with plans to follow-up today for further optimization of evidence-based medical therapy.  ***   Labs independently reviewed: 03/2021 - TSH normal, BUN 14, serum creatinine 0.66, potassium 4.5, albumin 4.6, AST/ALT normal, Hgb 13.7, PLT 264, magnesium 2.1  Past Medical History:  Diagnosis Date   Acquired hypothyroidism    Atrial fibrillation (HCC) 02/2021   Cardiomyopathy (HCC) 01/2017   Echo showed mildly reduced EF of roughly 40%-increase to 45% with exercise.   H/O congenital atrial septal defect (ASD) repair 1984   In [place]   Hypertension    Positive ANA (antinuclear antibody)    Has polyneuropathy   Varicose veins of both lower extremities    Actually seen by [facility]-November 2022    Past Surgical History:  Procedure Laterality Date   ABDOMINAL HYSTERECTOMY     ASD REPAIR  1984   Unknown details.  Was performed while living in [place].   CHOLECYSTECTOMY     COLONOSCOPY WITH PROPOFOL N/A 11/30/2017   Procedure: COLONOSCOPY WITH PROPOFOL;  Surgeon: Pasty Spillers, MD;  Location: [facility];  Service: Endoscopy;  Laterality: N/A;   COLONOSCOPY WITH PROPOFOL N/A 04/14/2019   Procedure: COLONOSCOPY WITH PROPOFOL;  Surgeon: Toney Reil, MD;  Location: [facility];  Service: Endoscopy;  Laterality: N/A;   ESOPHAGOGASTRODUODENOSCOPY (EGD) WITH PROPOFOL N/A 11/30/2017   Procedure: ESOPHAGOGASTRODUODENOSCOPY (EGD) WITH PROPOFOL;  Surgeon: Pasty Spillers, MD;  Location: [facility] ENDOSCOPY;  Service:  Endoscopy;  Laterality: N/A;   ESOPHAGOGASTRODUODENOSCOPY (EGD) WITH PROPOFOL N/A 04/14/2019   Procedure: ESOPHAGOGASTRODUODENOSCOPY (EGD) WITH PROPOFOL;  Surgeon: Toney Reil, MD;  Location: [facility];  Service: Endoscopy;  Laterality: N/A;   NM MYOVIEW LTD Bilateral 2016   [facility]: Normal LV function.  No ischemia.   Stress Echocardiogram  01/2017   ([facility]) mildly reduced LV function.  EF 40%.  Improved to 45% with exercise.    Current Medications: No outpatient medications have been marked as taking for the 07/11/21 encounter (Appointment) with [name], PA-C.    Allergies:   Patient has no known allergies.   Social History   Socioeconomic History   Marital status: Married    Spouse name: Not on file   Number of children: Not on file   Years of education: Not on file   Highest education level: Not on file  Occupational History   Not on file  Tobacco Use   Smoking status: Never   Smokeless tobacco: Never  Vaping Use   Vaping Use: Never used  Substance and Sexual Activity   Alcohol use: Not Currently   Drug use: Never   Sexual activity: Not on file  Other Topics Concern   Not on file  Social History Narrative   She and her husband are originally from [place].  They do not speak [place].      Due to language barrier history, not able to obtain much information.      She has less than high school graduate level education.   Social Determinants of Health   Financial Resource Strain: Not on file  Food Insecurity: Not on file  Transportation Needs: Not on file  Physical Activity: Not on file  Stress: Not on file  Social Connections: Not on file     Family History:  The patient's family history includes Stroke in her mother. There is no history of Breast cancer.  ROS:   ROS   EKGs/Labs/Other Studies Reviewed:    Studies reviewed were summarized above. The additional studies were reviewed today:  Coronary CTA  04/07/2021: FINDINGS: Aorta: Normal size. Mild aortic root and descending aorta calcifications. No dissection.   Aortic Valve:  Trileaflet.  No calcifications.   Coronary Arteries:  Normal coronary origin.  Right dominance.   RCA is a dominant artery that gives rise to PDA and PLA. There is no plaque.   Left main is a large artery that gives rise to LAD and LCX arteries. LM has no disease.   LAD has no plaque.   LCX is a non-dominant artery that gives rise to four obtuse marginal branches. There is no plaque.   OTHER FINDINGS: OTHER FINDINGS Atrial septal occluder device noted. No evidence of atrial septal leak.   Normal pulmonary vein drainage into the left atrium.   Normal left atrial appendage without a thrombus.   Normal size of the pulmonary artery.   IMPRESSION: 1. Normal coronary calcium score of 0. Patient is low risk for coronary events. 2. Normal coronary origin with right dominance. 3. No  evidence of CAD. 4. CAD-RADS 0. Consider non-atherosclerotic causes of chest pain. 5. Atrial septal occluder device noted. No evidence of atrial septal defect or leak. __________   2D echo 03/29/2021: 1. Left ventricular ejection fraction, by estimation, is 50 to 55%. The  left ventricle has low normal function. The left ventricle has no regional  wall motion abnormalities. Left ventricular diastolic parameters are  consistent with Grade II diastolic  dysfunction (pseudonormalization).   2. Right ventricular systolic function is normal. The right ventricular  size is normal. There is mildly elevated pulmonary artery systolic  pressure. The estimated right ventricular systolic pressure is 36.8 mmHg.   3. The mitral valve is normal in structure. Mild mitral valve  regurgitation. No evidence of mitral stenosis.   4. Tricuspid valve regurgitation is mild to moderate.   5. The aortic valve is normal in structure. Aortic valve regurgitation is  mild. No aortic stenosis is  present.   6. The inferior vena cava is normal in size with greater than 50%  respiratory variability, suggesting right atrial pressure of 3 mmHg. __________   Stress echo 01/18/2017 ([facility]):INTERPRETATION  ABNORMAL STRESS ECHOCARDIOGRAM ABNORMAL RESTING STUDY, IMPROVEMENT WITH STRESS  NORMAL RIGHT VENTRICULAR SYSTOLIC FUNCTION  MILD VALVULAR REGURGITATION (See above)  NO VALVULAR STENOSIS NOTED  MILD LV ENLARGEMENT  MILD RV ENLARGEMENT  __________   Nuclear stress test 04/13/2014 [facility]): Impressions:  - Normal myocardial perfusion study  - No evidence for significant ischemia or scar is noted.  - Post stress: Global systolic function is normal. The ejection fraction  calculated at 59%.  - No significant coronary calcifications were noted on the attenuation CT  - Attenuation CT scan shows post ASD repair findings __________   2D echo 04/13/2014 [facility]): Normal  left ventricular  ejection fraction  (60-65%)  Diastolic  left ventricular  dysfunction  Elevated  left ventricular  filling pressures  Degenerative  mitral valve  disease  Mitral  valve prolapse  Mitral  regurgitation (trivial)  Dilated  left atrium  Normal  right ventricular  contractile performance  Tricuspid  regurgitation  (trivial)    EKG:  EKG is ordered today.  The EKG ordered today demonstrates ***  Recent Labs: 03/17/2021: ALT 22; BUN 14; Creatinine, Ser 0.66; Hemoglobin 13.7; Platelets 264; Potassium 4.5; Sodium 141; TSH 1.870  Recent Lipid Panel No results found for: CHOL, TRIG, HDL, CHOLHDL, VLDL, LDLCALC, LDLDIRECT  PHYSICAL EXAM:    VS:  There were no vitals taken for this visit.  BMI: There is no height or weight on file to calculate BMI.  Physical Exam  Wt Readings from Last 3 Encounters:  04/12/21 150 lb (68 kg)  03/17/21 150 lb 2 oz (68.1 kg)  02/18/21 149 lb (67.6 kg)     ASSESSMENT & PLAN:   PAF:  Dilated cardiomyopathy:  History of ASD repair:  Aortic atherosclerosis/HLD:    Hypothyroidism:  Pulmonary nodule:  Language barrier:   {Are you ordering a CV Procedure (e.g. stress test, cath, DCCV, TEE, etc)?   Press F2        :[id number]}     Disposition: F/u with Dr. Herbie BaltimoreHarding or an APP in ***.   Medication Adjustments/Labs and Tests Ordered: Current medicines are reviewed at length with the patient today.  Concerns regarding medicines are outlined above. Medication changes, Labs and Tests ordered today are summarized above and listed in the Patient Instructions accessible in Encounters.   Signed, Eula Listenyan Melquan Ernsberger, PA-C 07/10/2021 9:20 AM     [facility]  [address], [place] [id number] [phone]

## 2021-07-11 ENCOUNTER — Ambulatory Visit: Payer: Medicare Other | Admitting: Physician Assistant

## 2021-07-12 ENCOUNTER — Encounter: Payer: Self-pay | Admitting: Physician Assistant

## 2021-11-23 NOTE — Progress Notes (Unsigned)
Cardiology Office Note    Date:  11/24/2021   ID:  demeka sutter, [place] May 21, 1950, MRN [id number]  PCP:  Sandrea Hughs, NP  Cardiologist:  Bryan Lemma, MD  Electrophysiologist:  None   Chief Complaint: Follow up  History of Present Illness:   Kristi Conrad is a 71 y.o. female with history of PAF on Xarelto, dilated cardiomyopathy with a prior EF of 40%, remote ASD repair > 30 years ago in [place], aortic atherosclerosis, HLD, hypothyroidism, varicose veins, and chronic low back pain with polyneuropathy who presents for follow up of PAF and cardiomyopathy.    She was previously followed by Dr. Darrold Junker, though more recently has established care with Dr. Herbie Baltimore on 03/17/2021.    Prior nuclear stress test in 2016 at [facility] showed no evidence of ischemia or scar with an EF of 59%.  No significant coronary artery calcifications and a prior ASD repair were noted.  Echo at that time showed a preserved LVSF with diastolic dysfunction, and mild mitral regurgitation.    Stress echo in 2018, performed at [facility], showed a resting EF of 40% with diffuse hypokinesis with stress images showing an EF of 45% with diffuse hypokinesis.    She was seen in the [facility] [place] ED in 02/2021 with abdominal pain and was noted to be in Afib with RVR.  CT of the abdomen/pelvis showed no acute findings.  High sensitivity troponin negative.  She was started on Xarelto and PTA metoprolol was continued.  She was treated for gastritis.    She established with Dr. Herbie Baltimore on 03/17/2021 and noted intermittent brief episodes of palpitations and was without chest pain. EKG demonstrated sinus rhythm.  Lower extremity ultrasound on 03/17/2021 was negative for DVT bilaterally.  Echo on 03/29/2021 showed an EF of 50-55%, no RWMA, Gr2DD, RV systolic function and ventricular cavity size normal, mildly elevated PASP estimated at 36.8 mmHg, mild mitral regurgitation, mild to moderate tricuspid regurgitation, and an  estimated right atrial pressure of 3 mmHg.  Coronary CTA on 04/07/2021 showed a calcium score of 0 and no evidence of CAD.  Atrial septal occluder device was noted and without leak.  Noncardiac overread showed a small hiatal hernia along with a small solid pulmonary nodule of the right lower lobe measuring 5 mm.  She was last seen in the office in 04/2021 and was without symptoms of angina or decompensation.  Escalation of GDMT was deferred at that time given she planned to leave for [place] for several months with recommendation to follow-up today for continued escalation of GDMT.   She comes in doing well from a cardiac perspective, and is without symptoms of angina or decompensation.  She does note intermittent palpitations that are short-lived, lasting a couple of minutes, predominantly at nighttime when laying down to sleep a couple days per month.  She has been out of Xarelto for 4 months, as she ran out while in [place].  She denies any symptoms concerning for falls or bleeding.  No lower extremity swelling, orthopnea, dizziness, presyncope, or syncope.  She also notes a 3-day history of posterior left-sided neck spasm that has not improved with heat or ice.  She requests treatment for this today.  Otherwise, she is without complaints.     Labs independently reviewed: 03/2021 - TSH normal, BUN 14, serum creatinine 0.66, potassium 4.5, albumin 4.6, AST/ALT normal, Hgb 13.7, PLT 264, magnesium 2.1    Past Medical History:  Diagnosis Date   Acquired  hypothyroidism    Atrial fibrillation (HCC) 02/2021   Cardiomyopathy (HCC) 01/2017   Echo showed mildly reduced EF of roughly 40%-increase to 45% with exercise.   H/O congenital atrial septal defect (ASD) repair 1984   In [place]   Hypertension    Positive ANA (antinuclear antibody)    Has polyneuropathy   Varicose veins of both lower extremities    Actually seen by [facility]-November 2022    Past Surgical History:  Procedure  Laterality Date   ABDOMINAL HYSTERECTOMY     ASD REPAIR  1984   Unknown details.  Was performed while living in [place].   CHOLECYSTECTOMY     COLONOSCOPY WITH PROPOFOL N/A 11/30/2017   Procedure: COLONOSCOPY WITH PROPOFOL;  Surgeon: Pasty Spillers, MD;  Location: [facility];  Service: Endoscopy;  Laterality: N/A;   COLONOSCOPY WITH PROPOFOL N/A 04/14/2019   Procedure: COLONOSCOPY WITH PROPOFOL;  Surgeon: Toney Reil, MD;  Location: [facility];  Service: Endoscopy;  Laterality: N/A;   ESOPHAGOGASTRODUODENOSCOPY (EGD) WITH PROPOFOL N/A 11/30/2017   Procedure: ESOPHAGOGASTRODUODENOSCOPY (EGD) WITH PROPOFOL;  Surgeon: Pasty Spillers, MD;  Location: [facility];  Service: Endoscopy;  Laterality: N/A;   ESOPHAGOGASTRODUODENOSCOPY (EGD) WITH PROPOFOL N/A 04/14/2019   Procedure: ESOPHAGOGASTRODUODENOSCOPY (EGD) WITH PROPOFOL;  Surgeon: Toney Reil, MD;  Location: [facility];  Service: Endoscopy;  Laterality: N/A;   NM MYOVIEW LTD Bilateral 2016   [facility]: Normal LV function.  No ischemia.   Stress Echocardiogram  01/2017   ([facility]) mildly reduced LV function.  EF 40%.  Improved to 45% with exercise.    Current Medications: Current Meds  Medication Sig   levothyroxine (SYNTHROID, LEVOTHROID) 50 MCG tablet Take 50 mcg by mouth daily before breakfast.   losartan (COZAAR) 25 MG tablet Take 0.5 tablets (12.5 mg total) by mouth daily.   predniSONE (STERAPRED UNI-PAK 21 TAB) 10 MG (21) TBPK tablet Take 5 tablets (50 mg total) by mouth daily for 1 day, THEN 4 tablets (40 mg total) daily for 1 day, THEN 3 tablets (30 mg total) daily for 1 day, THEN 2 tablets (20 mg total) daily for 1 day, THEN 1 tablet (10 mg total) daily for 1 day.   sitaGLIPtin (JANUVIA) 50 MG tablet Take 50 mg by mouth daily.    Allergies:   Patient has no known allergies.   Social History   Socioeconomic History   Marital status: Married    Spouse name: Not on file    Number of children: Not on file   Years of education: Not on file   Highest education level: Not on file  Occupational History   Not on file  Tobacco Use   Smoking status: Never   Smokeless tobacco: Never  Vaping Use   Vaping Use: Never used  Substance and Sexual Activity   Alcohol use: Not Currently   Drug use: Never   Sexual activity: Not on file  Other Topics Concern   Not on file  Social History Narrative   She and her husband are originally from [place].  They do not speak [place].      Due to language barrier history, not able to obtain much information.      She has less than high school graduate level education.   Social Determinants of Health   Financial Resource Strain: Not on file  Food Insecurity: Not on file  Transportation Needs: Not on file  Physical Activity: Not on file  Stress: Not on file  Social Connections: Not on file     Family History:  The patient's family history includes Stroke in her mother. There is no history of Breast cancer.  ROS:   12-point review of systems is negative unless otherwise noted in the HPI.   EKGs/Labs/Other Studies Reviewed:    Studies reviewed were summarized above. The additional studies were reviewed today:  Coronary CTA 04/07/2021: FINDINGS: Aorta: Normal size. Mild aortic root and descending aorta calcifications. No dissection.   Aortic Valve:  Trileaflet.  No calcifications.   Coronary Arteries:  Normal coronary origin.  Right dominance.   RCA is a dominant artery that gives rise to PDA and PLA. There is no plaque.   Left main is a large artery that gives rise to LAD and LCX arteries. LM has no disease.   LAD has no plaque.   LCX is a non-dominant artery that gives rise to four obtuse marginal branches. There is no plaque.   OTHER FINDINGS: OTHER FINDINGS Atrial septal occluder device noted. No evidence of atrial septal leak.   Normal pulmonary vein drainage into the left atrium.   Normal left  atrial appendage without a thrombus.   Normal size of the pulmonary artery.   IMPRESSION: 1. Normal coronary calcium score of 0. Patient is low risk for coronary events. 2. Normal coronary origin with right dominance. 3. No evidence of CAD. 4. CAD-RADS 0. Consider non-atherosclerotic causes of chest pain. 5. Atrial septal occluder device noted. No evidence of atrial septal defect or leak. __________   2D echo 03/29/2021: 1. Left ventricular ejection fraction, by estimation, is 50 to 55%. The  left ventricle has low normal function. The left ventricle has no regional  wall motion abnormalities. Left ventricular diastolic parameters are  consistent with Grade II diastolic  dysfunction (pseudonormalization).   2. Right ventricular systolic function is normal. The right ventricular  size is normal. There is mildly elevated pulmonary artery systolic  pressure. The estimated right ventricular systolic pressure is 08.6 mmHg.   3. The mitral valve is normal in structure. Mild mitral valve  regurgitation. No evidence of mitral stenosis.   4. Tricuspid valve regurgitation is mild to moderate.   5. The aortic valve is normal in structure. Aortic valve regurgitation is  mild. No aortic stenosis is present.   6. The inferior vena cava is normal in size with greater than 50%  respiratory variability, suggesting right atrial pressure of 3 mmHg. __________   Stress echo 01/18/2017 ([facility]):INTERPRETATION  ABNORMAL STRESS ECHOCARDIOGRAM ABNORMAL RESTING STUDY, IMPROVEMENT WITH STRESS  NORMAL RIGHT VENTRICULAR SYSTOLIC FUNCTION  MILD VALVULAR REGURGITATION (See above)  NO VALVULAR STENOSIS NOTED  MILD LV ENLARGEMENT  MILD RV ENLARGEMENT  __________   Nuclear stress test 04/13/2014 [facility]): Impressions:  - Normal myocardial perfusion study  - No evidence for significant ischemia or scar is noted.  - Post stress: Global systolic function is normal. The ejection fraction  calculated at 59%.  -  No significant coronary calcifications were noted on the attenuation CT  - Attenuation CT scan shows post ASD repair findings __________   2D echo 04/13/2014 [facility]): Normal  left ventricular  ejection fraction  (57-84%)  Diastolic  left ventricular  dysfunction  Elevated  left ventricular  filling pressures  Degenerative  mitral valve  disease  Mitral  valve prolapse  Mitral  regurgitation (trivial)  Dilated  left atrium  Normal  right ventricular  contractile performance  Tricuspid  regurgitation  (trivial)    EKG:  EKG  is ordered today.  The EKG ordered today demonstrates sinus bradycardia, 53 bpm, rare PAC, nonspecific T waves in aVL and V2 possibly related to lead placement  Recent Labs: 03/17/2021: ALT 22; BUN 14; Creatinine, Ser 0.66; Hemoglobin 13.7; Platelets 264; Potassium 4.5; Sodium 141; TSH 1.870  Recent Lipid Panel No results found for: "CHOL", "TRIG", "HDL", "CHOLHDL", "VLDL", "LDLCALC", "LDLDIRECT"  PHYSICAL EXAM:    VS:  BP 120/72   Pulse (!) 53   Ht 5' (1.524 m)   Wt 144 lb (65.3 kg)   SpO2 98%   BMI 28.12 kg/m   BMI: Body mass index is 28.12 kg/m.  Physical Exam Vitals reviewed.  Constitutional:      Appearance: She is well-developed.  HENT:     Head: Normocephalic and atraumatic.  Eyes:     General:        Right eye: No discharge.        Left eye: No discharge.  Neck:     Vascular: No JVD.   Cardiovascular:     Rate and Rhythm: Regular rhythm. Bradycardia present.     Pulses:          Posterior tibial pulses are 2+ on the right side and 2+ on the left side.     Heart sounds: Normal heart sounds, S1 normal and S2 normal. Heart sounds not distant. No midsystolic click and no opening snap. No murmur heard.    No friction rub.  Pulmonary:     Effort: Pulmonary effort is normal. No respiratory distress.     Breath sounds: Normal breath sounds. No decreased breath sounds, wheezing or rales.  Chest:     Chest wall: No tenderness.  Abdominal:      General: There is no distension.  Musculoskeletal:     Cervical back: Normal range of motion.     Right lower leg: No edema.     Left lower leg: No edema.  Skin:    General: Skin is warm and dry.     Nails: There is no clubbing.  Neurological:     Mental Status: She is alert and oriented to person, place, and time.  Psychiatric:        Speech: Speech normal.        Behavior: Behavior normal.        Thought Content: Thought content normal.        Judgment: Judgment normal.     Wt Readings from Last 3 Encounters:  11/24/21 144 lb (65.3 kg)  04/12/21 150 lb (68 kg)  03/17/21 150 lb 2 oz (68.1 kg)     ASSESSMENT & PLAN:   PAF: Maintaining sinus rhythm with a mildly bradycardic heart rate, asymptomatic.  Decrease Toprol-XL to 25 mg daily given bradycardic rate and in an effort to escalate GDMT as outlined below.  CHA2DS2-VASc at least 5 (CHF, HTN, age x1, vascular disease, sex category).  Restart Xarelto 20 mg q. dinner (she has been out for 4 months).  Creatinine clearance 78.  Follow-up BMP and CBC in 1 week.  Dilated cardiomyopathy: She appears euvolemic and well compensated with NYHA class I symptoms.  Her weight is down 6 pounds today when compared to her visit in 04/2021.  Decrease Toprol-XL to 25 mg daily given bradycardic rate.  Add losartan 12.5 mg daily.  Follow-up BMP in 1 week.  She is not requiring a standing diuretic.  Continue to escalate GDMT as able.  History of ASD repair: No evidence of device leak on  recent imaging.  Aortic atherosclerosis/HLD: No coronary artery calcification noted on recent coronary CTA.  Aggressive risk factor/lifestyle modification.  Needs lipid panel at next visit if not already obtained through PCP's office.  Hypothyroidism: TSH normal earlier this year.  She remains on replacement therapy with levothyroxine 7.  Follow-up with PCP.  Pulmonary nodule: She has been advised to follow-up with her PCP.  Neck spasm: Trial of 5-day prednisone taper.   Advised if symptoms persist to follow-up with PCP.  Language barrier: [place] medical interpreter utilized for today's encounter.    Disposition: F/u with Dr. Herbie Baltimore or an APP in 1 month.   Medication Adjustments/Labs and Tests Ordered: Current medicines are reviewed at length with the patient today.  Concerns regarding medicines are outlined above. Medication changes, Labs and Tests ordered today are summarized above and listed in the Patient Instructions accessible in Encounters.   Signed, Eula Listen, PA-C 11/24/2021 10:39 AM     [facility] [place] [address] [place], [place] [address] [phone]

## 2021-11-24 ENCOUNTER — Ambulatory Visit: Payer: Medicare Other | Attending: Physician Assistant | Admitting: Physician Assistant

## 2021-11-24 ENCOUNTER — Encounter: Payer: Self-pay | Admitting: Physician Assistant

## 2021-11-24 VITALS — BP 120/72 | HR 53 | Ht 60.0 in | Wt 144.0 lb

## 2021-11-24 DIAGNOSIS — I700 Atherosclerosis of aorta: Secondary | ICD-10-CM | POA: Insufficient documentation

## 2021-11-24 DIAGNOSIS — I480 Paroxysmal atrial fibrillation: Secondary | ICD-10-CM | POA: Insufficient documentation

## 2021-11-24 DIAGNOSIS — E039 Hypothyroidism, unspecified: Secondary | ICD-10-CM | POA: Insufficient documentation

## 2021-11-24 DIAGNOSIS — R911 Solitary pulmonary nodule: Secondary | ICD-10-CM | POA: Insufficient documentation

## 2021-11-24 DIAGNOSIS — Z8774 Personal history of (corrected) congenital malformations of heart and circulatory system: Secondary | ICD-10-CM | POA: Diagnosis not present

## 2021-11-24 DIAGNOSIS — Z789 Other specified health status: Secondary | ICD-10-CM | POA: Diagnosis present

## 2021-11-24 DIAGNOSIS — M62838 Other muscle spasm: Secondary | ICD-10-CM | POA: Diagnosis present

## 2021-11-24 DIAGNOSIS — I420 Dilated cardiomyopathy: Secondary | ICD-10-CM | POA: Diagnosis not present

## 2021-11-24 DIAGNOSIS — E782 Mixed hyperlipidemia: Secondary | ICD-10-CM | POA: Insufficient documentation

## 2021-11-24 DIAGNOSIS — Z603 Acculturation difficulty: Secondary | ICD-10-CM

## 2021-11-24 MED ORDER — RIVAROXABAN 20 MG PO TABS: 20.0000 mg | ORAL_TABLET | Freq: Every day | ORAL | 3 refills | Status: DC

## 2021-11-24 MED ORDER — PREDNISONE 10 MG (21) PO TBPK: ORAL_TABLET | ORAL | 0 refills | Status: AC

## 2021-11-24 MED ORDER — METOPROLOL SUCCINATE ER 25 MG PO TB24: 25.0000 mg | ORAL_TABLET | Freq: Every day | ORAL | 3 refills | Status: DC

## 2021-11-24 MED ORDER — LOSARTAN POTASSIUM 25 MG PO TABS: 12.5000 mg | ORAL_TABLET | Freq: Every day | ORAL | 3 refills | Status: DC

## 2021-11-24 NOTE — Patient Instructions (Addendum)
Medication Instructions:  Your physician has recommended you make the following change in your medication:   CHANGE Toprol XL to 25 mg once daily START Losartan 12.5 mg once daily START Xarelto 20 mg daily with supper START Prednisone 10 mg as follows: Take 5 tablets on day 1 Take 4 tablets on day 2 Take 3 tablets on day 3 Take 2 tablets on day 4 Take 1 tablet on day 5  *If you need a refill on your cardiac medications before your next appointment, please call your pharmacy*   Lab Work: BMET & CBC in one week over at the [facility] entrance. Check in at registration and NO appointment is needed.   Lab hours: Monday- Friday (7:30 am- 5:30 pm)  If you have labs (blood work) drawn today and your tests are completely normal, you will receive your results only by: [place] (if you have MyChart) OR A paper copy in the mail If you have any lab test that is abnormal or we need to change your treatment, we will call you to review the results.   Testing/Procedures: None   Follow-Up: At [facility], you and your health needs are our priority.  As part of our continuing mission to provide you with exceptional heart care, we have created designated Provider Care Teams.  These Care Teams include your primary Cardiologist (physician) and Advanced Practice Providers (APPs -  Physician Assistants and Nurse Practitioners) who all work together to provide you with the care you need, when you need it.   Your next appointment:   1 month(s)  The format for your next appointment:   In Person  Provider:   Glenetta Hew, MD or Christell Faith, PA-C       Important Information About Sugar

## 2021-12-01 ENCOUNTER — Other Ambulatory Visit
Admission: RE | Admit: 2021-12-01 | Discharge: 2021-12-01 | Disposition: A | Discharge status: Home / Self Care | Payer: Medicare Other | Attending: Physician Assistant | Admitting: Physician Assistant

## 2021-12-01 DIAGNOSIS — I480 Paroxysmal atrial fibrillation: Secondary | ICD-10-CM | POA: Diagnosis present

## 2021-12-01 DIAGNOSIS — I420 Dilated cardiomyopathy: Secondary | ICD-10-CM | POA: Diagnosis present

## 2021-12-01 DIAGNOSIS — I700 Atherosclerosis of aorta: Secondary | ICD-10-CM | POA: Insufficient documentation

## 2021-12-01 DIAGNOSIS — Z8774 Personal history of (corrected) congenital malformations of heart and circulatory system: Secondary | ICD-10-CM | POA: Diagnosis present

## 2021-12-01 LAB — BASIC METABOLIC PANEL
Anion gap: 5 (ref 5–15)
BUN: 13 mg/dL (ref 8–23)
CO2: 30 mmol/L (ref 22–32)
Calcium: 8.7 mg/dL — ABNORMAL LOW (ref 8.9–10.3)
Chloride: 103 mmol/L (ref 98–111)
Creatinine, Ser: 0.6 mg/dL (ref 0.44–1.00)
GFR, Estimated: >60 mL/min (ref >60)
Glucose, Bld: 123 mg/dL — ABNORMAL HIGH (ref 70–99)
Potassium: 4.2 mmol/L (ref 3.5–5.1)
Sodium: 138 mmol/L (ref 135–145)

## 2021-12-01 LAB — CBC
HCT: 42.3 % (ref 36.0–46.0)
Hemoglobin: 13.6 g/dL (ref 12.0–15.0)
MCH: 26.5 pg (ref 26.0–34.0)
MCHC: 32.2 g/dL (ref 30.0–36.0)
MCV: 82.3 fL (ref 80.0–100.0)
Platelets: 280 10*3/uL (ref 150–400)
RBC: 5.14 MIL/uL — ABNORMAL HIGH (ref 3.87–5.11)
RDW: 14.6 % (ref 11.5–15.5)
WBC: 7 10*3/uL (ref 4.0–10.5)
nRBC: 0 % (ref 0.0–0.2)

## 2022-01-07 NOTE — Progress Notes (Unsigned)
Cardiology Office Note    Date:  01/12/2022   ID:  Kristi Conrad,  02-16-1951, MRN [id number]  PCP:  Sandrea Hughs, NP  Cardiologist:  Bryan Lemma, MD  Electrophysiologist:  None   Chief Complaint: Follow-up  History of Present Illness:   Kristi Conrad is a 71 y.o. female with history of PAF on Xarelto, dilated cardiomyopathy with a prior EF of 40%, remote ASD repair > 30 years ago in [place], aortic atherosclerosis, HLD, hypothyroidism, varicose veins, and chronic low back pain with polyneuropathy who presents for follow up of PAF and cardiomyopathy.    She was previously followed by Dr. Darrold Junker, though more recently has established care with Dr. Herbie Baltimore on 03/17/2021.    Prior nuclear stress test in 2016 at [facility] showed no evidence of ischemia or scar with an EF of 59%.  No significant coronary artery calcifications and a prior ASD repair were noted.  Echo at that time showed a preserved LVSF with diastolic dysfunction, and mild mitral regurgitation.    Stress echo in 2018, performed at [facility], showed a resting EF of 40% with diffuse hypokinesis with stress images showing an EF of 45% with diffuse hypokinesis.    She was seen in the [facility] ED in 02/2021 with abdominal pain and was noted to be in Afib with RVR.  CT of the abdomen/pelvis showed no acute findings.  High sensitivity troponin negative.  She was started on Xarelto and PTA metoprolol was continued.  She was treated for gastritis.    She established with Dr. Herbie Baltimore on 03/17/2021 and noted intermittent brief episodes of palpitations and was without chest pain. EKG demonstrated sinus rhythm.  Lower extremity ultrasound on 03/17/2021 was negative for DVT bilaterally.  Echo on 03/29/2021 showed an EF of 50-55%, no RWMA, Gr2DD, RV systolic function and ventricular cavity size normal, mildly elevated PASP estimated at 36.8 mmHg, mild mitral regurgitation, mild to moderate tricuspid regurgitation, and an  estimated right atrial pressure of 3 mmHg.  Coronary CTA on 04/07/2021 showed a calcium score of 0 and no evidence of CAD.  Atrial septal occluder device was noted and without leak.  Noncardiac overread showed a small hiatal hernia along with a small solid pulmonary nodule of the right lower lobe measuring 5 mm.  She was seen in the office in 04/2021 and was without symptoms of angina or decompensation.  Escalation of GDMT was deferred at that time given she planned to leave for [place] for several months with recommendation to follow-up today for continued escalation of GDMT.  She was last seen in the office in 11/2021 and remained without symptoms of angina or decompensation.  She did note intermittent short-lived palpitations that primarily occurred at nighttime.  She had been out of Xarelto for approximately 4 months while in [place].  Toprol-XL was decreased to 25 mg daily given bradycardia and in an effort to add losartan 12.5 mg daily.  She was restarted on Xarelto.  She comes in doing well from a cardiac perspective and is without symptoms of angina or decompensation.  She does note some pinpoint fleeting chest discomfort that is worse with rotational movement and reproduced to palpation.  No dyspnea, palpitations, dizziness, presyncope, or syncope.  She has tolerated the addition of losartan without issues.  No falls or symptoms concerning for bleeding.  No lower extremity swelling.  Overall, she was like she is doing well though is anxious at times.   Labs independently reviewed: 11/2021 -  potassium 4.2, BUN 13, serum creatinine 0.6, Hgb 13.6, PLT 280 03/2021 - TSH normal, albumin 4.6, AST/ALT normal, magnesium 2.1   Past Medical History:  Diagnosis Date   Acquired hypothyroidism    Atrial fibrillation (HCC) 02/2021   Cardiomyopathy (HCC) 01/2017   Echo showed mildly reduced EF of roughly 40%-increase to 45% with exercise.   H/O congenital atrial septal defect (ASD) repair 1984   In [place]    Hypertension    Positive ANA (antinuclear antibody)    Has polyneuropathy   Varicose veins of both lower extremities    Actually seen by [facility]-November 2022    Past Surgical History:  Procedure Laterality Date   ABDOMINAL HYSTERECTOMY     ASD REPAIR  1984   Unknown details.  Was performed while living in [place].   CHOLECYSTECTOMY     COLONOSCOPY WITH PROPOFOL N/A 11/30/2017   Procedure: COLONOSCOPY WITH PROPOFOL;  Surgeon: Pasty Spillers, MD;  Location: [facility];  Service: Endoscopy;  Laterality: N/A;   COLONOSCOPY WITH PROPOFOL N/A 04/14/2019   Procedure: COLONOSCOPY WITH PROPOFOL;  Surgeon: Toney Reil, MD;  Location: [facility];  Service: Endoscopy;  Laterality: N/A;   ESOPHAGOGASTRODUODENOSCOPY (EGD) WITH PROPOFOL N/A 11/30/2017   Procedure: ESOPHAGOGASTRODUODENOSCOPY (EGD) WITH PROPOFOL;  Surgeon: Pasty Spillers, MD;  Location: [facility];  Service: Endoscopy;  Laterality: N/A;   ESOPHAGOGASTRODUODENOSCOPY (EGD) WITH PROPOFOL N/A 04/14/2019   Procedure: ESOPHAGOGASTRODUODENOSCOPY (EGD) WITH PROPOFOL;  Surgeon: Toney Reil, MD;  Location: [facility];  Service: Endoscopy;  Laterality: N/A;   NM MYOVIEW LTD Bilateral 2016   [facility]: Normal LV function.  No ischemia.   Stress Echocardiogram  01/2017   ([facility]) mildly reduced LV function.  EF 40%.  Improved to 45% with exercise.    Current Medications: Current Meds  Medication Sig   DULoxetine (CYMBALTA) 30 MG capsule daily as needed.   levothyroxine (SYNTHROID, LEVOTHROID) 50 MCG tablet Take 50 mcg by mouth daily before breakfast.   losartan (COZAAR) 25 MG tablet Take 0.5 tablets (12.5 mg total) by mouth daily.   rivaroxaban (XARELTO) 20 MG TABS tablet Take 1 tablet (20 mg total) by mouth daily with supper.   sitaGLIPtin (JANUVIA) 50 MG tablet Take 50 mg by mouth daily.    Allergies:   Patient has no known allergies.   Social History    Socioeconomic History   Marital status: Married    Spouse name: Not on file   Number of children: Not on file   Years of education: Not on file   Highest education level: Not on file  Occupational History   Not on file  Tobacco Use   Smoking status: Never   Smokeless tobacco: Never  Vaping Use   Vaping Use: Never used  Substance and Sexual Activity   Alcohol use: Not Currently   Drug use: Never   Sexual activity: Not on file  Other Topics Concern   Not on file  Social History Narrative   She and her husband are originally from [place].  They do not speak [place].      Due to language barrier history, not able to obtain much information.      She has less than high school graduate level education.   Social Determinants of Health   Financial Resource Strain: Not on file  Food Insecurity: Not on file  Transportation Needs: Not on file  Physical Activity: Not on file  Stress: Not on file  Social Connections:  Not on file     Family History:  The patient's family history includes Stroke in her mother. There is no history of Breast cancer.  ROS:   12-point review of systems is negative unless otherwise noted in HPI.   EKGs/Labs/Other Studies Reviewed:    Studies reviewed were summarized above. The additional studies were reviewed today:  Coronary CTA 04/07/2021: FINDINGS: Aorta: Normal size. Mild aortic root and descending aorta calcifications. No dissection.   Aortic Valve:  Trileaflet.  No calcifications.   Coronary Arteries:  Normal coronary origin.  Right dominance.   RCA is a dominant artery that gives rise to PDA and PLA. There is no plaque.   Left main is a large artery that gives rise to LAD and LCX arteries. LM has no disease.   LAD has no plaque.   LCX is a non-dominant artery that gives rise to four obtuse marginal branches. There is no plaque.   OTHER FINDINGS: OTHER FINDINGS Atrial septal occluder device noted. No evidence of atrial  septal leak.   Normal pulmonary vein drainage into the left atrium.   Normal left atrial appendage without a thrombus.   Normal size of the pulmonary artery.   IMPRESSION: 1. Normal coronary calcium score of 0. Patient is low risk for coronary events. 2. Normal coronary origin with right dominance. 3. No evidence of CAD. 4. CAD-RADS 0. Consider non-atherosclerotic causes of chest pain. 5. Atrial septal occluder device noted. No evidence of atrial septal defect or leak. __________   2D echo 03/29/2021: 1. Left ventricular ejection fraction, by estimation, is 50 to 55%. The  left ventricle has low normal function. The left ventricle has no regional  wall motion abnormalities. Left ventricular diastolic parameters are  consistent with Grade II diastolic  dysfunction (pseudonormalization).   2. Right ventricular systolic function is normal. The right ventricular  size is normal. There is mildly elevated pulmonary artery systolic  pressure. The estimated right ventricular systolic pressure is 36.8 mmHg.   3. The mitral valve is normal in structure. Mild mitral valve  regurgitation. No evidence of mitral stenosis.   4. Tricuspid valve regurgitation is mild to moderate.   5. The aortic valve is normal in structure. Aortic valve regurgitation is  mild. No aortic stenosis is present.   6. The inferior vena cava is normal in size with greater than 50%  respiratory variability, suggesting right atrial pressure of 3 mmHg. __________   Stress echo 01/18/2017 ([facility]):INTERPRETATION  ABNORMAL STRESS ECHOCARDIOGRAM ABNORMAL RESTING STUDY, IMPROVEMENT WITH STRESS  NORMAL RIGHT VENTRICULAR SYSTOLIC FUNCTION  MILD VALVULAR REGURGITATION (See above)  NO VALVULAR STENOSIS NOTED  MILD LV ENLARGEMENT  MILD RV ENLARGEMENT  __________   Nuclear stress test 04/13/2014 [facility]): Impressions:  - Normal myocardial perfusion study  - No evidence for significant ischemia or scar is noted.  - Post  stress: Global systolic function is normal. The ejection fraction  calculated at 59%.  - No significant coronary calcifications were noted on the attenuation CT  - Attenuation CT scan shows post ASD repair findings __________   2D echo 04/13/2014 [facility]): Normal  left ventricular  ejection fraction  (60-65%)  Diastolic  left ventricular  dysfunction  Elevated  left ventricular  filling pressures  Degenerative  mitral valve  disease  Mitral  valve prolapse  Mitral  regurgitation (trivial)  Dilated  left atrium  Normal  right ventricular  contractile performance  Tricuspid  regurgitation  (trivial)    EKG:  EKG is ordered today.  The EKG ordered today demonstrates sinus bradycardia, 51 bpm, baseline artifact, nonspecific ST-T changes  Recent Labs: 03/17/2021: ALT 22; TSH 1.870 12/01/2021: BUN 13; Creatinine, Ser 0.60; Hemoglobin 13.6; Platelets 280; Potassium 4.2; Sodium 138  Recent Lipid Panel No results found for: "CHOL", "TRIG", "HDL", "CHOLHDL", "VLDL", "LDLCALC", "LDLDIRECT"  PHYSICAL EXAM:    VS:  BP 118/60 (BP Location: Left Arm, Patient Position: Sitting, Cuff Size: Normal)   Pulse (!) 51   Ht 5' (1.524 m)   Wt 141 lb 6 oz (64.1 kg)   SpO2 98%   BMI 27.61 kg/m   BMI: Body mass index is 27.61 kg/m.  Physical Exam Vitals reviewed.  Constitutional:      Appearance: She is well-developed.  HENT:     Head: Normocephalic and atraumatic.  Eyes:     General:        Right eye: No discharge.        Left eye: No discharge.  Neck:     Vascular: No JVD.  Cardiovascular:     Rate and Rhythm: Normal rate and regular rhythm.     Pulses:          Posterior tibial pulses are 2+ on the right side and 2+ on the left side.     Heart sounds: Normal heart sounds, S1 normal and S2 normal. Heart sounds not distant. No midsystolic click and no opening snap. No murmur heard.    No friction rub.     Comments: Palpation of the left anterior chest wall reproduces her discomfort. Pulmonary:      Effort: Pulmonary effort is normal. No respiratory distress.     Breath sounds: Normal breath sounds. No decreased breath sounds, wheezing or rales.  Chest:     Chest wall: No tenderness.  Abdominal:     General: There is no distension.  Musculoskeletal:     Cervical back: Normal range of motion.     Right lower leg: No edema.     Left lower leg: No edema.  Skin:    General: Skin is warm and dry.     Nails: There is no clubbing.  Neurological:     Mental Status: She is alert and oriented to person, place, and time.  Psychiatric:        Speech: Speech normal.        Behavior: Behavior normal.        Thought Content: Thought content normal.        Judgment: Judgment normal.     Wt Readings from Last 3 Encounters:  01/12/22 141 lb 6 oz (64.1 kg)  11/24/21 144 lb (65.3 kg)  04/12/21 150 lb (68 kg)     ASSESSMENT & PLAN:   Dilated cardiomyopathy: She appears euvolemic and well compensated with NYHA class I symptoms.  Her weight is down 3 pounds today when compared to revisit in 11/2021.  Continue current GDMT including Toprol-XL and losartan.  CHF education.  PAF: Maintaining sinus rhythm with a bradycardic rate and is asymptomatic.  Continue low-dose Toprol-XL 25 mg daily.  CHA2DS2-VASc at least 5 (CHF, HTN, age x1, vascular disease, sex category).  She remains on Xarelto 20 mg daily.  Creatinine clearance 84.  Recent CBC, renal function, and electrolytes stable.  History of ASD repair: No evidence of device leak on recent imaging.  Aortic atherosclerosis/HLD: No coronary artery calcification noted on recent coronary CTA.  Aggressive risk factor and lifestyle modification.  Intermittent brief episodes of chest discomfort are atypical.  No  further ischemic testing is indicated at this time.  Obtain lipid panel, direct LDL, and LFT.  Hypothyroidism: TSH normal earlier this year.  She remains on replacement therapy with levothyroxine.  Follow-up with PCP.  Pulmonary nodule: She  has been advised to follow-up with her PCP.  Language barrier: [place] medical interpreter utilized for today's encounter.    Disposition: F/u with Dr. Ellyn Hack or an APP in 6 months.   Medication Adjustments/Labs and Tests Ordered: Current medicines are reviewed at length with the patient today.  Concerns regarding medicines are outlined above. Medication changes, Labs and Tests ordered today are summarized above and listed in the Patient Instructions accessible in Encounters.   Signed, Christell Faith, PA-C 01/12/2022 3:31 PM     [facility] [address] [place], [place] [address] [phone]

## 2022-01-12 ENCOUNTER — Ambulatory Visit: Payer: Medicare Other | Attending: Physician Assistant | Admitting: Physician Assistant

## 2022-01-12 ENCOUNTER — Encounter: Payer: Self-pay | Admitting: Physician Assistant

## 2022-01-12 VITALS — BP 118/60 | HR 51 | Ht 60.0 in | Wt 141.4 lb

## 2022-01-12 DIAGNOSIS — E782 Mixed hyperlipidemia: Secondary | ICD-10-CM | POA: Diagnosis present

## 2022-01-12 DIAGNOSIS — E039 Hypothyroidism, unspecified: Secondary | ICD-10-CM

## 2022-01-12 DIAGNOSIS — I700 Atherosclerosis of aorta: Secondary | ICD-10-CM

## 2022-01-12 DIAGNOSIS — R911 Solitary pulmonary nodule: Secondary | ICD-10-CM | POA: Diagnosis present

## 2022-01-12 DIAGNOSIS — I420 Dilated cardiomyopathy: Secondary | ICD-10-CM | POA: Diagnosis not present

## 2022-01-12 DIAGNOSIS — I480 Paroxysmal atrial fibrillation: Secondary | ICD-10-CM | POA: Diagnosis not present

## 2022-01-12 DIAGNOSIS — Z8774 Personal history of (corrected) congenital malformations of heart and circulatory system: Secondary | ICD-10-CM | POA: Insufficient documentation

## 2022-01-12 DIAGNOSIS — Z789 Other specified health status: Secondary | ICD-10-CM | POA: Diagnosis present

## 2022-01-12 NOTE — Patient Instructions (Signed)
Medication Instructions:  No changes at this time.   *If you need a refill on your cardiac medications before your next appointment, please call your pharmacy*   Lab Work: Lipid, Direct LDL, and LFT today over at the [facility]. Stop at registration desk.   If you have labs (blood work) drawn today and your tests are completely normal, you will receive your results only by: MyChart Message (if you have MyChart) OR A paper copy in the mail If you have any lab test that is abnormal or we need to change your treatment, we will call you to review the results.   Testing/Procedures: None   Follow-Up: At [facility], you and your health needs are our priority.  As part of our continuing mission to provide you with exceptional heart care, we have created designated Provider Care Teams.  These Care Teams include your primary Cardiologist (physician) and Advanced Practice Providers (APPs -  Physician Assistants and Nurse Practitioners) who all work together to provide you with the care you need, when you need it.   Your next appointment:   6 month(s)  The format for your next appointment:   In Person  Provider:   Bryan Lemma, MD or Eula Listen, PA-C       Important Information About Sugar

## 2022-01-13 ENCOUNTER — Other Ambulatory Visit
Admission: RE | Admit: 2022-01-13 | Discharge: 2022-01-13 | Disposition: A | Discharge status: Home / Self Care | Payer: Medicare Other | Attending: Physician Assistant | Admitting: Physician Assistant

## 2022-01-13 DIAGNOSIS — Z789 Other specified health status: Secondary | ICD-10-CM | POA: Diagnosis present

## 2022-01-13 DIAGNOSIS — I480 Paroxysmal atrial fibrillation: Secondary | ICD-10-CM | POA: Insufficient documentation

## 2022-01-13 DIAGNOSIS — R911 Solitary pulmonary nodule: Secondary | ICD-10-CM | POA: Diagnosis present

## 2022-01-13 DIAGNOSIS — I420 Dilated cardiomyopathy: Secondary | ICD-10-CM | POA: Diagnosis present

## 2022-01-13 DIAGNOSIS — Z8774 Personal history of (corrected) congenital malformations of heart and circulatory system: Secondary | ICD-10-CM | POA: Diagnosis present

## 2022-01-13 DIAGNOSIS — E039 Hypothyroidism, unspecified: Secondary | ICD-10-CM | POA: Diagnosis present

## 2022-01-13 DIAGNOSIS — I700 Atherosclerosis of aorta: Secondary | ICD-10-CM | POA: Insufficient documentation

## 2022-01-13 DIAGNOSIS — E782 Mixed hyperlipidemia: Secondary | ICD-10-CM | POA: Insufficient documentation

## 2022-01-13 LAB — LDL CHOLESTEROL, DIRECT: Direct LDL: 169 mg/dL — ABNORMAL HIGH (ref 0–99)

## 2022-01-13 LAB — HEPATIC FUNCTION PANEL
ALT: 19 U/L (ref 0–44)
AST: 24 U/L (ref 15–41)
Albumin: 4.2 g/dL (ref 3.5–5.0)
Alkaline Phosphatase: 78 U/L (ref 38–126)
Bilirubin, Direct: <0.1 mg/dL (ref 0.0–0.2)
Total Bilirubin: 0.8 mg/dL (ref 0.3–1.2)
Total Protein: 7.3 g/dL (ref 6.5–8.1)

## 2022-01-13 LAB — LIPID PANEL
Cholesterol: 247 mg/dL — ABNORMAL HIGH (ref 0–200)
HDL: 38 mg/dL — ABNORMAL LOW (ref >40)
LDL Cholesterol: 165 mg/dL — ABNORMAL HIGH (ref 0–99)
Total CHOL/HDL Ratio: 6.5 RATIO
Triglycerides: 222 mg/dL — ABNORMAL HIGH (ref <150)
VLDL: 44 mg/dL — ABNORMAL HIGH (ref 0–40)

## 2022-01-17 ENCOUNTER — Telehealth: Payer: Self-pay | Admitting: *Deleted

## 2022-01-17 DIAGNOSIS — I700 Atherosclerosis of aorta: Secondary | ICD-10-CM

## 2022-01-17 DIAGNOSIS — E782 Mixed hyperlipidemia: Secondary | ICD-10-CM

## 2022-01-17 MED ORDER — ATORVASTATIN CALCIUM 40 MG PO TABS: 40.0000 mg | ORAL_TABLET | Freq: Every day | ORAL | 3 refills | Status: DC

## 2022-01-17 NOTE — Telephone Encounter (Signed)
Spoke with patient using [employer] interpreter [employer] [place] ID# [phone]. Reviewed results and recommendations with patient and instructions for repeat labs. Confirmed which pharmacy to send her medication and advised that I would send that over. She verbalized understanding of our conversation with no further questions at this time.

## 2022-01-17 NOTE — Telephone Encounter (Signed)
-----   Message from [name], PA-C sent at 01/13/2022  3:13 PM EST ----- Liver function normal Total cholesterol elevated at 247 with goal less than 200, triglyceride elevated at 222 with goal less than 150, HDL low at 38 with goal greater than 40, LDL elevated at 169 with a goal less than 70 with noted aortic atherosclerosis.  Recommendations: -If she is agreeable, start atorvastatin 40 mg daily with a follow-up lipid and liver panel in 2 to 3 months -Reduce sugary foods and drinks to assist with improvement in triglyceride level -Maintain an active lifestyle to improve HDL

## 2022-05-25 ENCOUNTER — Other Ambulatory Visit: Payer: Self-pay | Admitting: Primary Care

## 2022-05-25 DIAGNOSIS — Z1231 Encounter for screening mammogram for malignant neoplasm of breast: Secondary | ICD-10-CM

## 2022-07-05 ENCOUNTER — Ambulatory Visit
Admission: RE | Admit: 2022-07-05 | Discharge: 2022-07-05 | Disposition: A | Discharge status: Home / Self Care | Payer: Medicare Other | Source: Ambulatory Visit | Attending: Primary Care | Admitting: Primary Care

## 2022-07-05 DIAGNOSIS — Z1231 Encounter for screening mammogram for malignant neoplasm of breast: Secondary | ICD-10-CM | POA: Insufficient documentation

## 2022-07-20 ENCOUNTER — Ambulatory Visit: Payer: Medicare Other | Attending: Cardiology | Admitting: Cardiology

## 2022-07-20 ENCOUNTER — Encounter: Payer: Self-pay | Admitting: Cardiology

## 2022-07-20 VITALS — BP 128/60 | HR 50 | Ht 62.0 in | Wt 143.2 lb

## 2022-07-20 DIAGNOSIS — R0602 Shortness of breath: Secondary | ICD-10-CM | POA: Insufficient documentation

## 2022-07-20 DIAGNOSIS — I480 Paroxysmal atrial fibrillation: Secondary | ICD-10-CM | POA: Insufficient documentation

## 2022-07-20 DIAGNOSIS — R002 Palpitations: Secondary | ICD-10-CM

## 2022-07-20 DIAGNOSIS — M7989 Other specified soft tissue disorders: Secondary | ICD-10-CM

## 2022-07-20 DIAGNOSIS — E782 Mixed hyperlipidemia: Secondary | ICD-10-CM

## 2022-07-20 DIAGNOSIS — Z8774 Personal history of (corrected) congenital malformations of heart and circulatory system: Secondary | ICD-10-CM | POA: Diagnosis present

## 2022-07-20 DIAGNOSIS — I420 Dilated cardiomyopathy: Secondary | ICD-10-CM | POA: Insufficient documentation

## 2022-07-20 MED ORDER — LOSARTAN POTASSIUM 25 MG PO TABS: 12.5000 mg | ORAL_TABLET | Freq: Every day | ORAL | 3 refills | Status: AC

## 2022-07-20 MED ORDER — METOPROLOL SUCCINATE ER 25 MG PO TB24: 25.0000 mg | ORAL_TABLET | Freq: Every day | ORAL | 3 refills | Status: AC

## 2022-07-20 NOTE — Assessment & Plan Note (Signed)
Would recommend recheck labs this summer, if LDL still seem to be elevated over 130) would probably consider initiating therapy.

## 2022-07-20 NOTE — Assessment & Plan Note (Signed)
Resolved based on recent echo. On low-dose ARB and beta-blocker but no CHF symptoms.  NYHA class I symptoms.

## 2022-07-20 NOTE — Assessment & Plan Note (Signed)
Trivial swelling.  Stasis changes in the left leg.  I suspect that she may be best served speaking with her PCP or better but this does not appear to be any signs of swelling not indurated.  Recommend foot elevation and support stockings.

## 2022-07-20 NOTE — Assessment & Plan Note (Signed)
Noted to be functioning well by echo and CT scan.

## 2022-07-20 NOTE — Patient Instructions (Signed)
Medication Instructions:  Your Physician recommend you continue on your current medication as directed.    *If you need a refill on your cardiac medications before your next appointment, please call your pharmacy*   Lab Work: No labs ordered at this time. If you have labs (blood work) drawn today and your tests are completely normal, you will receive your results only by: MyChart Message (if you have MyChart) OR A paper copy in the mail If you have any lab test that is abnormal or we need to change your treatment, we will call you to review the results.   Testing/Procedures: No testing ordered at this time.    Follow-Up: At [facility], you and your health needs are our priority.  As part of our continuing mission to provide you with exceptional heart care, we have created designated Provider Care Teams.  These Care Teams include your primary Cardiologist (physician) and Advanced Practice Providers (APPs -  Physician Assistants and Nurse Practitioners) who all work together to provide you with the care you need, when you need it.  We recommend signing up for the patient portal called "MyChart".  Sign up information is provided on this After Visit Summary.  MyChart is used to connect with patients for Virtual Visits (Telemedicine).  Patients are able to view lab/test results, encounter notes, upcoming appointments, etc.  Non-urgent messages can be sent to your provider as well.   To learn more about what you can do with MyChart, go to [email or web address].    Your next appointment:   12 month(s)  Provider:   You may see Bryan Lemma, MD or one of the following Advanced Practice Providers on your designated Care Team:   Nicolasa Ducking, NP Eula Listen, PA-C Cadence Fransico Michael, PA-C Charlsie Quest, NP

## 2022-07-20 NOTE — Assessment & Plan Note (Signed)
No breakthrough episodes.  Doing well.  No longer having any CHF symptoms.  No prolonged episodes of tachycardia.  Just fleeting symptoms.  I doubt she is having a breakthrough spells.  Was probably triggered by her critical illness last year.  For now continue with low-dose beta-blocker (cannot titrate further because of bradycardia). Continue Xarelto.

## 2022-07-20 NOTE — Assessment & Plan Note (Signed)
Short short-lived spells, nothing prolonged to suggest an arrhythmia.  Continue low-dose beta-blocker.

## 2022-07-20 NOTE — Progress Notes (Signed)
Primary Care Provider: Sandrea Hughs, NP [facility] Cardiologist: Bryan Lemma, MD Electrophysiologist: None  Clinic Note: Chief Complaint  Patient presents with   6 month follow up     "Doing well." Medications reviewed by the patient verbally.     ===================================  ASSESSMENT/PLAN   Problem List Items Addressed This Visit       Cardiology Problems   PAF (paroxysmal atrial fibrillation) (HCC) - Primary (Chronic)    No breakthrough episodes.  Doing well.  No longer having any CHF symptoms.  No prolonged episodes of tachycardia.  Just fleeting symptoms.  I doubt she is having a breakthrough spells.  Was probably triggered by her critical illness last year.  For now continue with low-dose beta-blocker (cannot titrate further because of bradycardia). Continue Xarelto.      Relevant Medications   losartan (COZAAR) 25 MG tablet   metoprolol succinate (TOPROL-XL) 25 MG 24 hr tablet   Other Relevant Orders   EKG 12-Lead   Hyperlipidemia (Chronic)    Would recommend recheck labs this summer, if LDL still seem to be elevated over 130) would probably consider initiating therapy.      Relevant Medications   losartan (COZAAR) 25 MG tablet   metoprolol succinate (TOPROL-XL) 25 MG 24 hr tablet   Other Relevant Orders   EKG 12-Lead   Dilated cardiomyopathy (HCC) (Chronic)    Resolved based on recent echo. On low-dose ARB and beta-blocker but no CHF symptoms.  NYHA class I symptoms.      Relevant Medications   losartan (COZAAR) 25 MG tablet   metoprolol succinate (TOPROL-XL) 25 MG 24 hr tablet   Other Relevant Orders   EKG 12-Lead     Other   Swelling of lower extremity    Trivial swelling.  Stasis changes in the left leg.  I suspect that she may be best served speaking with her PCP or better but this does not appear to be any signs of swelling not indurated.  Recommend foot elevation and support stockings.      Relevant Orders   EKG  12-Lead   SOB (shortness of breath) on exertion (Chronic)   Relevant Orders   EKG 12-Lead   Heart palpitations (Chronic)    Short short-lived spells, nothing prolonged to suggest an arrhythmia.  Continue low-dose beta-blocker.      H/O atrial septal defect repair (Chronic)    Noted to be functioning well by echo and CT scan.      Relevant Orders   EKG 12-Lead    ===================================  HPI:    Kristi Conrad is a 72 y.o. female with a PMH below who presents today for 54-month follow-up at the request Sandrea Hughs, NP  Allean Found de Cuzco was last seen on 01/12/22 by Carola Frost as a 57-month follow-up from an September visit.  She had only noted some few short-lived palpitations at nighttime.  Had been out of Xarelto since her trip to [place].  Because of bradycardia, her Toprol dose was reduced to 25 mg.  In follow-up-November, but was again doing well without any significant symptoms.  Pinpoint fleeting chest discomfort worse with movement and palpation.  No other symptoms.  No signs or symptoms of heart failure or A-fib.  Labs reviewed.  Normal.  Continued on low-dose losartan, Toprol and Xarelto.  Recent Hospitalizations: n/a  Reviewed  CV studies:    The following studies were reviewed today: (if available, images/films reviewed: From Epic Chart or  Care Everywhere) Cor CTA 04/07/2021: 1. Normal coronary calcium score of 0. Patient is low risk for coronary events. 2. Normal coronary origin with right dominance. 3. No evidence of CAD. 4. CAD-RADS 0. Consider non-atherosclerotic causes of chest pain. 5. Atrial septal occluder device noted. No evidence of atrial septal defect or leak Echo 03/29/21 1. Left ventricular ejection fraction, by estimation, is 50 to 55%. The left ventricle has low normal function. The left ventricle has no regional wall motion abnormalities. Left ventricular diastolic parameters are consistent with Grade II diastolic dysfunction  (pseudonormalization).   2. Right ventricular systolic function is normal. The right ventricular size is normal. There is mildly elevated pulmonary artery systolic pressure. The estimated right ventricular systolic pressure is 36.8 mmHg.   3. The mitral valve is normal in structure. Mild mitral valve regurgitation. No evidence of mitral stenosis.   4. Tricuspid valve regurgitation is mild to moderate.   5. The aortic valve is normal in structure. Aortic valve regurgitation is mild. No aortic stenosis is present.   6. The inferior vena cava is normal in size with greater than 50% respiratory variability, suggesting right atrial pressure of 3 mmHg.    Interval History:   Kristi Conrad returns today - doing well no major concerns.  Still has some mild fleeting pinching sensations in her chest worse when she lays on her left side at night.  Not exertional.  She only gets short of breath with more than vigorous activity.  Overall pretty stable from a cardiac standpoint.  Nothing to suggest A-fib.  Just notes rare  short-lived fluttering lasting seconds -- goes away with deep breathing. No more than seconds to ~1-2 min - not really associated with activity.  Does breathing exercises & that helps.   Otherwise seems to doing fairly well.  No major issues.  CV Review of Symptoms (Summary): positive for - dyspnea on exertion, irregular heartbeat, palpitations, and DOE with long / vigorous activity. Palpitations as noted, chest wall pain lying on L side negative for - chest pain, dyspnea on exertion, edema, loss of consciousness, orthopnea, paroxysmal nocturnal dyspnea, rapid heart rate, shortness of breath, or syncope/near syncope, TIA /amaurosis fugax/CVA Sx, claudication.   History obtained with the assistance of contracted Spanish interpreter-Amanda.   REVIEWED OF SYSTEMS   Review of Systems  Constitutional:  Negative for malaise/fatigue (occasionally - but no routinely) and weight loss.   HENT:  Negative for congestion and nosebleeds.   Eyes:  Negative for blurred vision.  Respiratory:  Negative for shortness of breath.   Cardiovascular:  Positive for chest pain (chest wall pain lying on L side).  Gastrointestinal:  Negative for blood in stool and melena.  Genitourinary:  Negative for hematuria.  Musculoskeletal:  Negative for joint pain.       Left leg just above the ankle hurts lightheaded today, worse with being on her feet for long period time and worse with certain movements.  Neurological:  Negative for dizziness and headaches.  Psychiatric/Behavioral:  Positive for memory loss.     I have reviewed and (if needed) personally updated the patient's problem list, medications, allergies, past medical and surgical history, social and family history.   PAST MEDICAL HISTORY   Past Medical History:  Diagnosis Date   Acquired hypothyroidism    Atrial fibrillation (HCC) 02/2021   Cardiomyopathy (HCC) 01/2017   Echo showed mildly reduced EF of roughly 40%-increase to 45% with exercise.   H/O congenital atrial septal defect (ASD)  repair 1984   In [place]   Hypertension    Positive ANA (antinuclear antibody)    Has polyneuropathy   Varicose veins of both lower extremities    Actually seen by [facility]-November 2022    PAST SURGICAL HISTORY   Past Surgical History:  Procedure Laterality Date   ABDOMINAL HYSTERECTOMY     ASD REPAIR  1984   Unknown details.  Was performed while living in [place].   CHOLECYSTECTOMY     COLONOSCOPY WITH PROPOFOL N/A 11/30/2017   Procedure: COLONOSCOPY WITH PROPOFOL;  Surgeon: Pasty Spillers, MD;  Location: [facility];  Service: Endoscopy;  Laterality: N/A;   COLONOSCOPY WITH PROPOFOL N/A 04/14/2019   Procedure: COLONOSCOPY WITH PROPOFOL;  Surgeon: Toney Reil, MD;  Location: [facility];  Service: Endoscopy;  Laterality: N/A;   ESOPHAGOGASTRODUODENOSCOPY (EGD) WITH PROPOFOL N/A 11/30/2017    Procedure: ESOPHAGOGASTRODUODENOSCOPY (EGD) WITH PROPOFOL;  Surgeon: Pasty Spillers, MD;  Location: [facility];  Service: Endoscopy;  Laterality: N/A;   ESOPHAGOGASTRODUODENOSCOPY (EGD) WITH PROPOFOL N/A 04/14/2019   Procedure: ESOPHAGOGASTRODUODENOSCOPY (EGD) WITH PROPOFOL;  Surgeon: Toney Reil, MD;  Location: [facility];  Service: Endoscopy;  Laterality: N/A;   NM MYOVIEW LTD Bilateral 2016   [facility]: Normal LV function.  No ischemia.   Stress Echocardiogram  01/2017   ([facility]) mildly reduced LV function.  EF 40%.  Improved to 45% with exercise.     There is no immunization history on file for this patient.  MEDICATIONS/ALLERGIES    Current Meds  Medication Sig   atorvastatin (LIPITOR) 40 MG tablet Take 1 tablet (40 mg total) by mouth daily.   B Complex Vitamins (VITAMIN B-COMPLEX) TABS Take 1 tablet by mouth daily.   levothyroxine (SYNTHROID, LEVOTHROID) 50 MCG tablet Take 50 mcg by mouth daily before breakfast.   losartan (COZAAR) 25 MG tablet Take 0.5 tablets (12.5 mg total) by mouth daily.   metoprolol succinate (TOPROL-XL) 25 MG 24 hr tablet Take 1 tablet (25 mg total) by mouth daily.   rivaroxaban (XARELTO) 20 MG TABS tablet Take 1 tablet (20 mg total) by mouth daily with supper.   sitaGLIPtin (JANUVIA) 50 MG tablet Take 50 mg by mouth daily.    No Known Allergies  SOCIAL HISTORY/FAMILY HISTORY   Reviewed in Epic:  Pertinent findings:  Social History   Tobacco Use   Smoking status: Never   Smokeless tobacco: Never  Vaping Use   Vaping Use: Never used  Substance Use Topics   Alcohol use: Not Currently   Drug use: Never   Social History   Social History Narrative   She and her husband are originally from [place].  They do not speak [place].      Due to language barrier history, not able to obtain much information.      She has less than high school graduate level education.    OBJCTIVE -PE, EKG, labs   Wt Readings from Last  3 Encounters:  07/20/22 143 lb 4 oz (65 kg)  01/12/22 141 lb 6 oz (64.1 kg)  11/24/21 144 lb (65.3 kg)    Physical Exam: Blood pressure 128/60, pulse (!) 50, height 5\' 2"  (1.575 m), weight 143 lb 4 oz (65 kg), SpO2 97 %. Body mass index is 26.2 kg/m.  Physical Exam Vitals reviewed.  Constitutional:      General: She is not in acute distress.    Appearance: Normal appearance. She is normal weight. She is not ill-appearing (Well-nourished,  bigeminy.  Healthy-appearing.) or toxic-appearing.  HENT:     Head: Normocephalic and atraumatic.  Neck:     Vascular: No carotid bruit.  Cardiovascular:     Rate and Rhythm: Normal rate and regular rhythm. No extrasystoles are present.    Chest Wall: PMI is not displaced.     Pulses: Normal pulses.     Heart sounds: Murmur (Soft 1/6 SEM at RUSB) heard.     No friction rub.     Comments: Normal S1 with fixed split S2. Pulmonary:     Effort: Pulmonary effort is normal. No respiratory distress.     Breath sounds: Normal breath sounds. No stridor. No wheezing or rales.  Chest:     Chest wall: Tenderness (Tenderness palpation on the left lateral side) present.  Musculoskeletal:        General: No swelling. Normal range of motion.     Cervical back: Normal range of motion and neck supple.  Skin:    General: Skin is warm and dry.     Comments: Mild/minimal stasis changes worse on the left side.  Mild spider veins.  Minimal varicose veins.  Neurological:     General: No focal deficit present.     Mental Status: She is alert and oriented to person, place, and time.     Gait: Gait normal.  Psychiatric:        Mood and Affect: Mood normal.        Behavior: Behavior normal.        Thought Content: Thought content normal.        Judgment: Judgment normal.     Adult ECG Report  Rate: 50 ;  Rhythm: sinus bradycardia and otherwise normal axis, intervals and durations. ;   Narrative Interpretation: Otherwise normal  Recent Labs: Reviewed Lab  Results  Component Value Date   CHOL 247 (H) 01/13/2022   HDL 38 (L) 01/13/2022   LDLCALC 165 (H) 01/13/2022   LDLDIRECT 169 (H) 01/13/2022   TRIG 222 (H) 01/13/2022   CHOLHDL 6.5 01/13/2022   Lab Results  Component Value Date   CREATININE 0.60 12/01/2021   BUN 13 12/01/2021   NA 138 12/01/2021   K 4.2 12/01/2021   CL 103 12/01/2021   CO2 30 12/01/2021      Latest Ref Rng & Units 12/01/2021    8:44 AM 03/17/2021   11:47 AM 02/18/2021    6:48 AM  CBC  WBC 4.0 - 10.5 K/uL 7.0  6.6  12.4   Hemoglobin 12.0 - 15.0 g/dL 16.1  09.6  04.5   Hematocrit 36.0 - 46.0 % 42.3  40.8  41.2   Platelets 150 - 400 K/uL 280  264  264     No results found for: "HGBA1C" Lab Results  Component Value Date   TSH 1.870 03/17/2021    ================================================== I spent a total of 26 minutes with the patient spent in direct patient consultation.  Additional time spent with chart review  / charting (studies, outside notes, etc): 30 min Total Time: 56 min  Current medicines are reviewed at length with the patient today.  (+/- concerns) N/A  Notice: This dictation was prepared with Dragon dictation along with smart phrase technology. Any transcriptional errors that result from this process are unintentional and may not be corrected upon review.  Studies Ordered:  Orders Placed This Encounter  Procedures   EKG 12-Lead   Meds ordered this encounter  Medications   losartan (COZAAR) 25 MG tablet  Sig: Take 0.5 tablets (12.5 mg total) by mouth daily.    Dispense:  45 tablet    Refill:  3   metoprolol succinate (TOPROL-XL) 25 MG 24 hr tablet    Sig: Take 1 tablet (25 mg total) by mouth daily.    Dispense:  90 tablet    Refill:  3    Patient Instructions / Medication Changes & Studies & Tests Ordered   Patient Instructions  Medication Instructions:  Your Physician recommend you continue on your current medication as directed.    *If you need a refill on your  cardiac medications before your next appointment, please call your pharmacy*   Lab Work: No labs ordered at this time. If you have labs (blood work) drawn today and your tests are completely normal, you will receive your results only by: MyChart Message (if you have MyChart) OR A paper copy in the mail If you have any lab test that is abnormal or we need to change your treatment, we will call you to review the results.   Testing/Procedures: No testing ordered at this time.    Follow-Up: At [facility], you and your health needs are our priority.  As part of our continuing mission to provide you with exceptional heart care, we have created designated Provider Care Teams.  These Care Teams include your primary Cardiologist (physician) and Advanced Practice Providers (APPs -  Physician Assistants and Nurse Practitioners) who all work together to provide you with the care you need, when you need it.  We recommend signing up for the patient portal called "MyChart".  Sign up information is provided on this After Visit Summary.  MyChart is used to connect with patients for Virtual Visits (Telemedicine).  Patients are able to view lab/test results, encounter notes, upcoming appointments, etc.  Non-urgent messages can be sent to your provider as well.   To learn more about what you can do with MyChart, go to [email or web address].    Your next appointment:   12 month(s)  Provider:   You may see Bryan Lemma, MD or one of the following Advanced Practice Providers on your designated Care Team:   Nicolasa Ducking, NP Eula Listen, PA-C Cadence Fransico Michael, PA-C Charlsie Quest, NP       Marykay Lex, MD, MS Bryan Lemma, M.D., M.S. Interventional Cardiologist  [facility]   [address]; [address] [place], [place]  [address] [phone]           Fax [phone]    Thank you for choosing [facility] in [place]!!

## 2022-10-16 ENCOUNTER — Other Ambulatory Visit: Payer: Self-pay

## 2022-10-23 ENCOUNTER — Ambulatory Visit: Payer: Medicare Other | Admitting: Gastroenterology

## 2022-11-09 ENCOUNTER — Other Ambulatory Visit: Payer: Self-pay | Admitting: Physician Assistant

## 2022-11-09 DIAGNOSIS — I480 Paroxysmal atrial fibrillation: Secondary | ICD-10-CM

## 2022-11-09 NOTE — Telephone Encounter (Signed)
Prescription refill request for Xarelto received.  Indication:  Last office visit: 07/20/22 Kristi Conrad)  Weight: 65kg Age: 72 Scr: 0.60 (12/01/21)  CrCl: 85.49ml/min  Appropriate dose. Refill sent.

## 2023-07-17 ENCOUNTER — Other Ambulatory Visit: Payer: Self-pay | Admitting: Primary Care

## 2023-07-17 DIAGNOSIS — Z1231 Encounter for screening mammogram for malignant neoplasm of breast: Secondary | ICD-10-CM

## 2023-07-24 ENCOUNTER — Ambulatory Visit
Admission: RE | Admit: 2023-07-24 | Discharge: 2023-07-24 | Disposition: A | Discharge status: Home / Self Care | Source: Ambulatory Visit | Attending: Primary Care | Admitting: Primary Care

## 2023-07-24 DIAGNOSIS — Z1231 Encounter for screening mammogram for malignant neoplasm of breast: Secondary | ICD-10-CM | POA: Insufficient documentation

## 2023-08-05 NOTE — Progress Notes (Unsigned)
 Cardiology Clinic Note   Date: 08/07/2023 ID: patty leitzke, [place] [birth year], MRN [id number]  Primary Cardiologist:  Randene Bustard, MD  Chief Complaint   Kristi Conrad is a 73 y.o. female who presents to the clinic today for routine follow up.   Patient Profile   Kristi Conrad is followed by Dr. Addie Holstein for the history outlined below.      Past medical history significant for: Dilated cardiomyopathy. Echo 03/29/2021: EF 50 to 55%.  No RWMA.  Grade II DD.  Normal RV size/function.  Mildly elevated PA pressure, RVSP 36.8 mmHg.  Mild MR. ASD repair. Coronary CTA 04/07/2021: Calcium  score of 0 with no evidence of CAD.  Atrial septal occluder device noted with no evidence of atrial septal leak. PAF. Onset December 2022. Hyperlipidemia. Lipid panel 07/04/2023: LDL 142, HDL 44, TG 269, total 235. GERD. Hypothyroidism. Pulmonary nodule. Coronary CTA 04/07/2021: Small solid nodule of the right lower lobe measuring 5 mm.  No follow-up needed if patient is low risk.  Noncontrast chest CT could be considered in 12 months if patient high risk.  In summary, patient was previously followed by Dr. Parks Bollman.  Prior nuclear stress test in [facility] showed no evidence of ischemia or scar with an EF of 59%, no significant coronary artery calcifications and a prior ASD repair noted.  Echo at that time showed preserved LV function with diastolic dysfunction and mild MR.  Stress echo in 2018 at [facility] cardi[facility] showed EF 40% with diffuse hypokinesis with stress images showing an EF of 45%.  In December 2022 she was seen in the ED for abdominal pain and noted to be in A-fib with RVR.  She was started on Xarelto  and continued on metoprolol  which she had been taking previously.    Patient established care with Dr. Addie Holstein in January 2023.  She reported intermittent brief episodes of palpitations.  EKG demonstrated sinus rhythm.  Echo at that time showed normal LV/RV function with mildly  elevated pulmonary pressure.  Coronary CTA February 2023 showed a calcium  score of 0 with no evidence of CAD.  Atrial septal occluder device was noted with no evidence of atrial septal leak.  Incidental finding of pulmonary nodule.  Patient was last seen in the office by Dr. Addie Holstein on 07/20/2022 for routine follow-up.  She was doing well at that time and no medication changes were made.     History of Present Illness    Today, patient is accompanied by her daughter in law. Visit is facilitated by in person [facility] interpretor. Patient reports chronic chest discomfort and palpitations associated with anxiety. She states she typically feels this when she is laying down at night. If she controls her breathing her anxiety will decrease and symptoms will resolve. She reports bilateral leg pain that she attributes to varicose veins. She is supposed to have surgery at some point for the varicose veins. She also reports bilateral leg pain that worsens with walking and is relieved with rest and leg elevation. She denies lower extremity edema. She tries to stay active performing stretches and doing light to moderate household activities. She denies shortness of breath, orthopnea or PND.     ROS: All other systems reviewed and are otherwise negative except as noted in History of Present Illness.  EKGs/Labs Reviewed    EKG Interpretation Date/Time:  Tuesday August 07 2023 13:57:06 EDT Ventricular Rate:  61 PR Interval:  160 QRS Duration:  80  QT Interval:  448 QTC Calculation: 450 R Axis:   51  Text Interpretation: Normal sinus rhythm Normal ECG When compared with ECG of 18-Feb-2021 05:34, PREVIOUS ECG IS PRESENT Confirmed by Morey Ar [phone]) on 08/07/2023 2:27:57 PM   Labs from outside facility 07/04/2023: Hemoglobin 13.7, hematocrit 42, sodium 139, potassium 4.1, creatinine 0.66, BUN 11, AST 31, ALT 26.  Risk Assessment/Calculations     CHA2DS2-VASc Score = 4   This indicates a 4.8%  annual risk of stroke. The patient's score is based upon: CHF History: 1 HTN History: 1 Diabetes History: 0 Stroke History: 0 Vascular Disease History: 0 Age Score: 1 Gender Score: 1             Physical Exam    VS:  BP (!) 124/56   Ht 5\' 2"  (1.575 m)   Wt 152 lb 6.4 oz (69.1 kg)   SpO2 96%   BMI 27.87 kg/m  , BMI Body mass index is 27.87 kg/m.  GEN: Well nourished, well developed, in no acute distress. Neck: No JVD or carotid bruits. Cardiac:  RRR. No murmurs. No rubs or gallops.   Respiratory:  Respirations regular and unlabored. Clear to auscultation without rales, wheezing or rhonchi. GI: Soft, nontender, nondistended. Extremities: Radials/DP/PT 2+ and equal bilaterally. No clubbing or cyanosis.  Mild nonpitting edema bilateral feet. Skin: Warm and dry, no rash. Neuro: Strength intact.  Assessment & Plan   Dilated cardiomyopathy Echo January 2023 showed EF 50 to 55%, Grade II DD, mildly elevated PA pressure, mild MR.  Patient denies lower extremity edema, shortness of breath, orthopnea, PND.  She has mild nonpitting edema bilateral feet otherwise euvolemic and well compensated on exam. - Continue Toprol , losartan .  History of ASD repair Coronary CTA February 2023 showed atrial septal occluder device with no evidence of atrial septal leak.  PAF Onset December 2022.  Denies spontaneous bleeding concerns.  Patient denies palpitations.  EKG demonstrates NSR 61 bpm. - Continue Toprol , Xarelto . - CBC today.  Hyperlipidemia LDL 142 April 2025.  Patient has not been taking atorvastatin  for an unknown period of time.  She is uncertain why she stopped it. - Restart atorvastatin .  Leg pain Patient has a history of varicose veins and is awaiting vein surgery.  She does report bilateral leg pain worsened with walking and relieved with resting and elevation. - Schedule ABI.  Disposition: ABI for claudication.  Restart atorvastatin  40 mg daily.  Return in 6 months or  sooner as needed.         Signed, Kristi Rives. Michelene Keniston, DNP, NP-C

## 2023-08-07 ENCOUNTER — Encounter: Payer: Self-pay | Admitting: Student

## 2023-08-07 ENCOUNTER — Ambulatory Visit: Attending: Student | Admitting: Student

## 2023-08-07 VITALS — BP 124/56 | Ht 62.0 in | Wt 152.4 lb

## 2023-08-07 DIAGNOSIS — M79604 Pain in right leg: Secondary | ICD-10-CM | POA: Insufficient documentation

## 2023-08-07 DIAGNOSIS — E785 Hyperlipidemia, unspecified: Secondary | ICD-10-CM | POA: Insufficient documentation

## 2023-08-07 DIAGNOSIS — I480 Paroxysmal atrial fibrillation: Secondary | ICD-10-CM | POA: Insufficient documentation

## 2023-08-07 DIAGNOSIS — Z8774 Personal history of (corrected) congenital malformations of heart and circulatory system: Secondary | ICD-10-CM | POA: Diagnosis present

## 2023-08-07 DIAGNOSIS — M79605 Pain in left leg: Secondary | ICD-10-CM | POA: Diagnosis present

## 2023-08-07 DIAGNOSIS — I420 Dilated cardiomyopathy: Secondary | ICD-10-CM | POA: Diagnosis present

## 2023-08-07 MED ORDER — ATORVASTATIN CALCIUM 40 MG PO TABS: 40.0000 mg | ORAL_TABLET | Freq: Every day | ORAL | 3 refills | Status: AC

## 2023-08-07 NOTE — Patient Instructions (Signed)
 Medication Instructions:  Your physician recommends the following medication changes.  START TAKING: Atorvastatin  40 mg once daily  *If you need a refill on your cardiac medications before your next appointment, please call your pharmacy*  Lab Work: None ordered at this time  If you have labs (blood work) drawn today and your tests are completely normal, you will receive your results only by: MyChart Message (if you have MyChart) OR A paper copy in the mail If you have any lab test that is abnormal or we need to change your treatment, we will call you to review the results.  Testing/Procedures: Your physician has requested that you have an ankle brachial index (ABI). During this test an ultrasound and blood pressure cuff are used to evaluate the arteries that supply the arms and legs with blood.  Allow thirty minutes for this exam.  There are no restrictions or special instructions.  This will take place at [address] [facility] [address], [place] [address]  Please note: We ask at that you not bring children with you during ultrasound (echo/ vascular) testing. Due to room size and safety concerns, children are not allowed in the ultrasound rooms during exams. Our front office staff cannot provide observation of children in our lobby area while testing is being conducted. An adult accompanying a patient to their appointment will only be allowed in the ultrasound room at the discretion of the ultrasound technician under special circumstances. We apologize for any inconvenience.   Follow-Up: At [facility], you and your health needs are our priority.  As part of our continuing mission to provide you with exceptional heart care, our providers are all part of one team.  This team includes your primary Cardiologist (physician) and Advanced Practice Providers or APPs (Physician Assistants and Nurse Practitioners) who all work together to provide you with the care you need,  when you need it.  Your next appointment:   6 month(s)  Provider:   You may see Randene Bustard, MD or one of the following Advanced Practice Providers on your designated Care Team:   Laneta Pintos, NP Gildardo Labrador, PA-C Varney Gentleman, PA-C Cadence Quincy, PA-C Ronald Cockayne, NP Morey Ar, NP    We recommend signing up for the patient portal called "MyChart".  Sign up information is provided on this After Visit Summary.  MyChart is used to connect with patients for Virtual Visits (Telemedicine).  Patients are able to view lab/test results, encounter notes, upcoming appointments, etc.  Non-urgent messages can be sent to your provider as well.   To learn more about what you can do with MyChart, go to [email or web address].

## 2023-09-24 ENCOUNTER — Ambulatory Visit

## 2023-12-06 NOTE — Progress Notes (Unsigned)
 Cardiology Clinic Note   Date: 12/07/2023 ID: zyaire dumas, Leith [birth year], MRN [id number]  Primary Cardiologist:  Alm Clay, MD  Chief Complaint   Kristi Conrad is a 73 y.o. female who presents to the clinic today for routine follow up.   Patient Profile   Kristi Conrad is followed by Dr. Clay for the history outlined below.       Past medical history significant for: Dilated cardiomyopathy. Echo 03/29/2021: EF 50 to 55%.  No RWMA.  Grade II DD.  Normal RV size/function.  Mildly elevated PA pressure, RVSP 36.8 mmHg.  Mild MR. ASD repair. Coronary CTA 04/07/2021: Calcium  score of 0 with no evidence of CAD.  Atrial septal occluder device noted with no evidence of atrial septal leak. PAF. Onset December 2022. Hyperlipidemia. Lipid panel 07/04/2023: LDL 142, HDL 44, TG 269, total 235. GERD. Hypothyroidism. Pulmonary nodule. Coronary CTA 04/07/2021: Small solid nodule of the right lower lobe measuring 5 mm.  No follow-up needed if patient is low risk.  Noncontrast chest CT could be considered in 12 months if patient high risk. T2DM.   In summary, patient was previously followed by Dr. Ammon.  Prior nuclear stress test in 2016 at [facility] showed no evidence of ischemia or scar with an EF of 59%, no significant coronary artery calcifications and a prior ASD repair noted.  Echo at that time showed preserved LV function with diastolic dysfunction and mild MR.  Stress echo in 2018 at [facility]ology showed EF 40% with diffuse hypokinesis with stress images showing an EF of 45%.  In December 2022 she was seen in the ED for abdominal pain and noted to be in A-fib with RVR.  She was started on Xarelto  and continued on metoprolol  which she had been taking previously.    Patient established care with Dr. Clay in January 2023.  She reported intermittent brief episodes of palpitations.  EKG demonstrated sinus rhythm.  Echo at that time showed normal LV/RV function  with mildly elevated pulmonary pressure.  Coronary CTA February 2023 showed a calcium  score of 0 with no evidence of CAD.  Atrial septal occluder device was noted with no evidence of atrial septal leak.  Incidental finding of pulmonary nodule.  Patient was last seen in the office by me on 08/07/2023 for routine follow-up.  She reported chronic chest discomfort and palpitations associated with anxiety that she typically experienced while laying down at night.  Symptoms decrease/resolve with controlled breathing.  She reported claudication.  She had not been taking atorvastatin  for an unknown reason but was uncertain why she had stopped.  She was instructed to restart atorvastatin .  ABIs were ordered but do not appear to have been completed.     History of Present Illness    Today, patient is accompanied by her husband. Communication assisted by in-person [facility] interpreter, Kristi Conrad. Patient reports she is doing well. Patient denies shortness of breath, dyspnea on exertion, lower extremity edema, orthopnea or PND. She reports occasional chest tightness unchanged from previous that resolves with controlled breathing. She states she has knee pain but is no longer experiencing claudication. Discussed ABIs that were ordered at last visit. Patient would like to defer testing, as she is not having any pain. She questions if there are any medications she can stop taking. Reviewed medication list in detail. She verbalized understanding of importance of adherence with current medication.     ROS: All other systems reviewed and are  otherwise negative except as noted in History of Present Illness.  EKGs/Labs Reviewed    EKG Interpretation Date/Time:  Friday December 07 2023 91:63:75 EDT Ventricular Rate:  50 PR Interval:  158 QRS Duration:  78 QT Interval:  470 QTC Calculation: 428 R Axis:   55  Text Interpretation: Sinus bradycardia with sinus arrhythmia Nonspecific ST abnormality When compared with ECG of  07-Aug-2023 13:57, No significant change was found Confirmed by Loistine Sober [phone]) on 12/07/2023 8:41:38 AM    Risk Assessment/Calculations     CHA2DS2-VASc Score = 4   This indicates a 4.8% annual risk of stroke. The patient's score is based upon: CHF History: 1 HTN History: 1 Diabetes History: 0 Stroke History: 0 Vascular Disease History: 0 Age Score: 1 Gender Score: 1         Physical Exam    VS:  BP (!) 140/70 (BP Location: Left Arm, Patient Position: Sitting) Comment: has not taken meds  Pulse (!) 50   Ht 5' 1 (1.549 m)   Wt 148 lb (67.1 kg)   SpO2 99%   BMI 27.96 kg/m  , BMI Body mass index is 27.96 kg/m.  GEN: Well nourished, well developed, in no acute distress. Neck: No JVD or carotid bruits. Cardiac:  RRR.  No murmur. No rubs or gallops.   Respiratory:  Respirations regular and unlabored. Clear to auscultation without rales, wheezing or rhonchi. GI: Soft, nontender, nondistended. Extremities: Radials/DP/PT 2+ and equal bilaterally. No clubbing or cyanosis. No edema.  Skin: Warm and dry, no rash. Neuro: Strength intact.  Assessment & Plan   Dilated cardiomyopathy Echo January 2023 showed EF 50 to 55%, Grade II DD, mildly elevated PA pressure, mild MR.  Patient denies lower extremity edema, shortness of breath, orthopnea, PND.  Euvolemic and well compensated on exam. - Continue Toprol , losartan .   History of ASD repair Coronary CTA February 2023 showed atrial septal occluder device with no evidence of atrial septal leak.   PAF Onset December 2022.  Denies spontaneous bleeding concerns.  Patient denies palpitations.  - Continue Toprol , Xarelto . - CBC today.    Hyperlipidemia LDL 142 April 2025.   - Continue atorvastatin . - Lipid panel and CMP today.   Hypertension BP today 140/70. Patient has not taken medications yet today. No headaches or dizziness.  - Continue Toprol , losartan .    Leg pain Patient has a history of varicose veins and is  awaiting vein surgery.  Patient states she is no longer experiencing claudication. She would like to defer ABIs. 2+ PT/DP pulses bilaterally.  - Cancel ABI order placed at last visit.   Disposition: CBC, CMP, lipid panel today. Return in 6 months or sooner as needed.          Signed, Sober HERO. Kaitlyne Friedhoff, DNP, NP-C

## 2023-12-07 ENCOUNTER — Encounter: Payer: Self-pay | Admitting: Student

## 2023-12-07 ENCOUNTER — Ambulatory Visit: Attending: Student | Admitting: Student

## 2023-12-07 VITALS — BP 140/70 | HR 50 | Ht 61.0 in | Wt 148.0 lb

## 2023-12-07 DIAGNOSIS — M79605 Pain in left leg: Secondary | ICD-10-CM | POA: Insufficient documentation

## 2023-12-07 DIAGNOSIS — I420 Dilated cardiomyopathy: Secondary | ICD-10-CM | POA: Insufficient documentation

## 2023-12-07 DIAGNOSIS — I10 Essential (primary) hypertension: Secondary | ICD-10-CM | POA: Diagnosis present

## 2023-12-07 DIAGNOSIS — M79604 Pain in right leg: Secondary | ICD-10-CM | POA: Insufficient documentation

## 2023-12-07 DIAGNOSIS — I480 Paroxysmal atrial fibrillation: Secondary | ICD-10-CM | POA: Insufficient documentation

## 2023-12-07 DIAGNOSIS — Z79899 Other long term (current) drug therapy: Secondary | ICD-10-CM | POA: Insufficient documentation

## 2023-12-07 DIAGNOSIS — E785 Hyperlipidemia, unspecified: Secondary | ICD-10-CM | POA: Insufficient documentation

## 2023-12-07 NOTE — Patient Instructions (Signed)
 Medication Instructions:   Your physician recommends that you continue on your current medications as directed. Please refer to the Current Medication list given to you today.    *If you need a refill on your cardiac medications before your next appointment, please call your pharmacy*  Lab Work:  Your provider would like for you to have following labs drawn today CBC, CMP, Lipid Panel.    If you have labs (blood work) drawn today and your tests are completely normal, you will receive your results only by:  MyChart Message (if you have MyChart) OR  A paper copy in the mail If you have any lab test that is abnormal or we need to change your treatment, we will call you to review the results.  Testing/Procedures:  None ordered at this time   Referrals:  None ordered at this time   Follow-Up:  At [facility], you and your health needs are our priority.  As part of our continuing mission to provide you with exceptional heart care, our providers are all part of one team.  This team includes your primary Cardiologist (physician) and Advanced Practice Providers or APPs (Physician Assistants and Nurse Practitioners) who all work together to provide you with the care you need, when you need it.  Your next appointment:   6 month(s)  Provider:    Alm Clay, MD or Barnie Hila, NP    We recommend signing up for the patient portal called MyChart.  Sign up information is provided on this After Visit Summary.  MyChart is used to connect with patients for Virtual Visits (Telemedicine).  Patients are able to view lab/test results, encounter notes, upcoming appointments, etc.  Non-urgent messages can be sent to your provider as well.   To learn more about what you can do with MyChart, go to [email or web address].

## 2023-12-08 LAB — COMPREHENSIVE METABOLIC PANEL WITH GFR
ALT: 25 IU/L (ref 0–32)
AST: 25 IU/L (ref 0–40)
Albumin: 4.2 g/dL (ref 3.8–4.8)
Alkaline Phosphatase: 121 IU/L (ref 49–135)
BUN/Creatinine Ratio: 19 (ref 12–28)
BUN: 13 mg/dL (ref 8–27)
Bilirubin Total: 0.5 mg/dL (ref 0.0–1.2)
CO2: 25 mmol/L (ref 20–29)
Calcium: 9.1 mg/dL (ref 8.7–10.3)
Chloride: 101 mmol/L (ref 96–106)
Creatinine, Ser: 0.7 mg/dL (ref 0.57–1.00)
Globulin, Total: 2.8 g/dL (ref 1.5–4.5)
Glucose: 110 mg/dL — ABNORMAL HIGH (ref 70–99)
Potassium: 4.8 mmol/L (ref 3.5–5.2)
Sodium: 139 mmol/L (ref 134–144)
Total Protein: 7 g/dL (ref 6.0–8.5)
eGFR: 91 mL/min/1.73 (ref >59)

## 2023-12-08 LAB — CBC
Hematocrit: 43.2 % (ref 34.0–46.6)
Hemoglobin: 14 g/dL (ref 11.1–15.9)
MCH: 27.9 pg (ref 26.6–33.0)
MCHC: 32.4 g/dL (ref 31.5–35.7)
MCV: 86 fL (ref 79–97)
Platelets: 249 x10E3/uL (ref 150–450)
RBC: 5.02 x10E6/uL (ref 3.77–5.28)
RDW: 13.6 % (ref 11.7–15.4)
WBC: 6.7 x10E3/uL (ref 3.4–10.8)

## 2023-12-08 LAB — LIPID PANEL
Chol/HDL Ratio: 3.9 ratio (ref 0.0–4.4)
Cholesterol, Total: 179 mg/dL (ref 100–199)
HDL: 46 mg/dL (ref >39)
LDL Chol Calc (NIH): 89 mg/dL (ref 0–99)
Triglycerides: 266 mg/dL — ABNORMAL HIGH (ref 0–149)
VLDL Cholesterol Cal: 44 mg/dL — ABNORMAL HIGH (ref 5–40)

## 2023-12-09 ENCOUNTER — Ambulatory Visit: Payer: Self-pay | Admitting: Student

## 2023-12-14 ENCOUNTER — Encounter: Payer: Self-pay | Admitting: Emergency Medicine

## 2023-12-14 NOTE — Progress Notes (Signed)
 Letter Sent.

## 2024-01-01 ENCOUNTER — Encounter: Payer: Self-pay | Admitting: Ophthalmology

## 2024-01-02 ENCOUNTER — Encounter: Payer: Self-pay | Admitting: Ophthalmology

## 2024-01-03 NOTE — Discharge Instructions (Signed)
 El cuidado despus de una operacin de cataratas  Cataract Surgery, Care After (Spanish)   Esta hoja le da informacin sobre cmo cuidarse despus de su ciruga. Su oftalmlogo puede darle instrucciones ms especficas tambin. Si tiene problemas o preguntas, comunquese con su doctor en [facility], [phone].   Qu puedo esperar despus de la ciruga?  Es normal tener:   Picazn   Sensacin de warehouse manager un cuerpo extrao (se siente como un grano de arena en el ojo)   Secrecin acuosa (lagrimeo excesivo)   Sensibilidad a la luz y al tacto   Moretones en el ojo o a su alrededor   Visin ligeramente borrosa   Siga estas instrucciones en casa:   No se toque ni se frote los ojos.   Es posible que le digan que use una pantalla protectora o lentes de sol para proteger sus ojos.   No se ponga un lente de contacto en el ojo operado hasta que su doctor lo apruebe.   Mantenga los prpados y la cara limpios y secos.   No deje que el agua le caiga directamente en la cara mientras se duche.   Evite el jabn y [place] [employer] ojos.   No se maquille los ojos por c.h. robinson worldwide.   Revsese su ojo todos los das para ver si hay signos de infeccin. Est atento(a):   Enrojecimiento, hinchazn o dolor.   Lquido, sangre o pus.   Empeoramiento de la visin.   Aumento de la sensibilidad a la luz o al tacto.   Actividad:   Education officer, museum, evite agacharse y neurosurgeon. Puede volver a leer y a production manager siguiente.   No maneje ni use maquinaria pesada durante al menos 24 horas.   Evite las actividades vigorosas durante una semana. Est bien realizar [place], usar la cinta de correr, usar la patent examiner y subir las escaleras.   No levante objetos pesados (ms de 20 libras) por una semana.   No realice trabajos de jardinera ni tareas domsticas sucias en la casa (como pacific mutual, los baos, pasar la aspiradora,  etc.) durante una semana.   No nade ni use un jacuzzi durante 2 semanas.   Pregunte a su doctor cundo usted puede volver a printmaker.   Instrucciones generales:   Tome o aplique los medicamentos recetados y de venta libre segn le indique su doctor, incluyendo las gotas para los ojos y las pomadas.   Contine tomando los medicamentos que fueron suspendidos antes de la ciruga, a menos que su doctor le indique lo contrario.   Vaya a todas sus citas de seguimiento que ya fueron programadas.

## 2024-01-08 ENCOUNTER — Ambulatory Visit: Payer: Self-pay | Admitting: Anesthesiology

## 2024-01-08 ENCOUNTER — Encounter: Payer: Self-pay | Admitting: Ophthalmology

## 2024-01-08 ENCOUNTER — Ambulatory Visit
Admission: RE | Admit: 2024-01-08 | Discharge: 2024-01-08 | Disposition: A | Discharge status: Home / Self Care | Attending: Ophthalmology | Admitting: Ophthalmology

## 2024-01-08 ENCOUNTER — Other Ambulatory Visit: Payer: Self-pay

## 2024-01-08 ENCOUNTER — Encounter
Admission: RE | Disposition: A | Discharge status: Home / Self Care | Payer: Self-pay | Source: Home / Self Care | Attending: Ophthalmology

## 2024-01-08 DIAGNOSIS — E039 Hypothyroidism, unspecified: Secondary | ICD-10-CM | POA: Diagnosis not present

## 2024-01-08 DIAGNOSIS — H2512 Age-related nuclear cataract, left eye: Secondary | ICD-10-CM | POA: Insufficient documentation

## 2024-01-08 DIAGNOSIS — I420 Dilated cardiomyopathy: Secondary | ICD-10-CM | POA: Insufficient documentation

## 2024-01-08 DIAGNOSIS — I4891 Unspecified atrial fibrillation: Secondary | ICD-10-CM | POA: Insufficient documentation

## 2024-01-08 DIAGNOSIS — Z7901 Long term (current) use of anticoagulants: Secondary | ICD-10-CM | POA: Diagnosis not present

## 2024-01-08 DIAGNOSIS — Z7984 Long term (current) use of oral hypoglycemic drugs: Secondary | ICD-10-CM | POA: Diagnosis not present

## 2024-01-08 DIAGNOSIS — E1136 Type 2 diabetes mellitus with diabetic cataract: Secondary | ICD-10-CM | POA: Diagnosis not present

## 2024-01-08 DIAGNOSIS — I110 Hypertensive heart disease with heart failure: Secondary | ICD-10-CM | POA: Insufficient documentation

## 2024-01-08 DIAGNOSIS — I509 Heart failure, unspecified: Secondary | ICD-10-CM | POA: Insufficient documentation

## 2024-01-08 HISTORY — DX: Other ill-defined heart diseases: I51.89

## 2024-01-08 HISTORY — DX: Dilated cardiomyopathy: I42.0

## 2024-01-08 HISTORY — PX: CATARACT EXTRACTION W/PHACO: SHX586

## 2024-01-08 HISTORY — DX: Prediabetes: R73.03

## 2024-01-08 SURGERY — PHACOEMULSIFICATION, CATARACT, WITH IOL INSERTION
Anesthesia: Monitor Anesthesia Care | Site: Eye | Laterality: Left

## 2024-01-08 MED ORDER — TETRACAINE HCL 0.5 % OP SOLN
OPHTHALMIC | Status: AC
  Filled 2024-01-08: qty 4

## 2024-01-08 MED ORDER — EPINEPHRINE PF 1 MG/ML IJ SOLN
INTRAMUSCULAR | Status: DC | PRN
  Administered 2024-01-08: 51 mL via OPHTHALMIC

## 2024-01-08 MED ORDER — BRIMONIDINE TARTRATE-TIMOLOL 0.2-0.5 % OP SOLN
OPHTHALMIC | Status: DC | PRN
  Administered 2024-01-08: 1 [drp] via OPHTHALMIC

## 2024-01-08 MED ORDER — MIDAZOLAM HCL (PF) 2 MG/2ML IJ SOLN
INTRAMUSCULAR | Status: DC | PRN
  Administered 2024-01-08: 1 mg via INTRAVENOUS

## 2024-01-08 MED ORDER — PHENYLEPHRINE HCL 10 % OP SOLN
1.0000 [drp] | OPHTHALMIC | Status: AC
  Administered 2024-01-08 (×3): 1 [drp] via OPHTHALMIC

## 2024-01-08 MED ORDER — FENTANYL CITRATE (PF) 100 MCG/2ML IJ SOLN
INTRAMUSCULAR | Status: AC
  Filled 2024-01-08: qty 2

## 2024-01-08 MED ORDER — MOXIFLOXACIN HCL 0.5 % OP SOLN
OPHTHALMIC | Status: DC | PRN
  Administered 2024-01-08: .2 mL via OPHTHALMIC

## 2024-01-08 MED ORDER — LACTATED RINGERS IV SOLN: INTRAVENOUS | Status: DC

## 2024-01-08 MED ORDER — FENTANYL CITRATE (PF) 100 MCG/2ML IJ SOLN
INTRAMUSCULAR | Status: DC | PRN
  Administered 2024-01-08: 50 ug via INTRAVENOUS

## 2024-01-08 MED ORDER — LIDOCAINE HCL (PF) 2 % IJ SOLN
INTRAOCULAR | Status: DC | PRN
  Administered 2024-01-08: 2 mL

## 2024-01-08 MED ORDER — ONDANSETRON HCL 4 MG/2ML IJ SOLN: 4.0000 mg | Freq: Once | INTRAMUSCULAR | Status: DC | PRN

## 2024-01-08 MED ORDER — MIDAZOLAM HCL 2 MG/2ML IJ SOLN
INTRAMUSCULAR | Status: AC
  Filled 2024-01-08: qty 2

## 2024-01-08 MED ORDER — SIGHTPATH DOSE#1 NA CHONDROIT SULF-NA HYALURON 40-17 MG/ML IO SOLN
INTRAOCULAR | Status: DC | PRN
  Administered 2024-01-08: 1 mL via INTRAOCULAR

## 2024-01-08 MED ORDER — TETRACAINE HCL 0.5 % OP SOLN
1.0000 [drp] | OPHTHALMIC | Status: DC | PRN
  Administered 2024-01-08 (×2): 1 [drp] via OPHTHALMIC

## 2024-01-08 MED ORDER — CYCLOPENTOLATE HCL 2 % OP SOLN
OPHTHALMIC | Status: AC
  Filled 2024-01-08: qty 2

## 2024-01-08 MED ORDER — CYCLOPENTOLATE HCL 2 % OP SOLN
1.0000 [drp] | OPHTHALMIC | Status: AC
  Administered 2024-01-08 (×3): 1 [drp] via OPHTHALMIC

## 2024-01-08 MED ORDER — SIGHTPATH DOSE#1 BSS IO SOLN
INTRAOCULAR | Status: DC | PRN
Start: 2024-01-08 — End: 2024-01-08
  Administered 2024-01-08: 15 mL via INTRAOCULAR

## 2024-01-08 MED ORDER — PHENYLEPHRINE HCL 10 % OP SOLN
OPHTHALMIC | Status: AC
Start: 2024-01-08 — End: 2024-01-08
  Filled 2024-01-08: qty 5

## 2024-01-08 SURGICAL SUPPLY — 10 items
CANNULA ANT/CHMB 27G (MISCELLANEOUS) ×1 IMPLANT
CYSTOTOME ANGL RVRS SHRT 25G (CUTTER) ×1 IMPLANT
FEE CATARACT SUITE SIGHTPATH (MISCELLANEOUS) ×1 IMPLANT
GLOVE BIOGEL PI IND STRL 8 (GLOVE) ×1 IMPLANT
GLOVE SURG LX STRL 8.0 MICRO (GLOVE) ×1 IMPLANT
GLOVE SURG SYN 6.5 PF PI BL (GLOVE) ×1 IMPLANT
LENS IOL TECNIS EYHANCE 23.0 (Intraocular Lens) IMPLANT
NDL FILTER BLUNT 18X1 1/2 (NEEDLE) ×1 IMPLANT
NEEDLE FILTER BLUNT 18X1 1/2 (NEEDLE) ×1 IMPLANT
SYR 3ML LL SCALE MARK (SYRINGE) ×1 IMPLANT

## 2024-01-08 NOTE — H&P (Signed)
 [facility]   Primary Care Physician:  Era Raisin, NP Ophthalmologist: Dr. Elsie Carmine  Pre-Procedure History & Physical: HPI:  Kristi Conrad is a 73 y.o. female here for cataract surgery.   Past Medical History:  Diagnosis Date   Acquired hypothyroidism    Atrial fibrillation (HCC) 02/2021   Cardiomyopathy (HCC) 01/2017   Echo showed mildly reduced EF of roughly 40%-increase to 45% with exercise.   Dilated cardiomyopathy (HCC)    Grade II diastolic dysfunction    H/O congenital atrial septal defect (ASD) repair 1984   In [place]   Hypertension    Positive ANA (antinuclear antibody)    Has polyneuropathy   Pre-diabetes    Varicose veins of both lower extremities    Actually seen by [facility]-November 2022    Past Surgical History:  Procedure Laterality Date   ABDOMINAL HYSTERECTOMY     ASD REPAIR  1984   Unknown details.  Was performed while living in [place].   CHOLECYSTECTOMY     COLONOSCOPY WITH PROPOFOL  N/A 11/30/2017   Procedure: COLONOSCOPY WITH PROPOFOL ;  Surgeon: Janalyn Keene NOVAK, MD;  Location: [facility] ENDOSCOPY;  Service: Endoscopy;  Laterality: N/A;   COLONOSCOPY WITH PROPOFOL  N/A 04/14/2019   Procedure: COLONOSCOPY WITH PROPOFOL ;  Surgeon: Unk Corinn Skiff, MD;  Location: [facility];  Service: Endoscopy;  Laterality: N/A;   ESOPHAGOGASTRODUODENOSCOPY (EGD) WITH PROPOFOL  N/A 11/30/2017   Procedure: ESOPHAGOGASTRODUODENOSCOPY (EGD) WITH PROPOFOL ;  Surgeon: Janalyn Keene NOVAK, MD;  Location: [facility] ENDOSCOPY;  Service: Endoscopy;  Laterality: N/A;   ESOPHAGOGASTRODUODENOSCOPY (EGD) WITH PROPOFOL  N/A 04/14/2019   Procedure: ESOPHAGOGASTRODUODENOSCOPY (EGD) WITH PROPOFOL ;  Surgeon: Unk Corinn Skiff, MD;  Location: [facility];  Service: Endoscopy;  Laterality: N/A;   NM MYOVIEW  LTD Bilateral 2016   [facility]: Normal LV function.  No ischemia.   Stress Echocardiogram  01/2017   ([facility]) mildly reduced  LV function.  EF 40%.  Improved to 45% with exercise.   WRIST FRACTURE SURGERY Left     Prior to Admission medications   Medication Sig Start Date End Date Taking? Authorizing Provider  atorvastatin  (LIPITOR) 40 MG tablet Take 1 tablet (40 mg total) by mouth daily. 08/07/23 01/08/24 Yes Wittenborn, Barnie, NP  levothyroxine (SYNTHROID, LEVOTHROID) 50 MCG tablet Take 50 mcg by mouth daily before breakfast. 08/21/17  Yes [provider]  metoprolol  succinate (TOPROL -XL) 25 MG 24 hr tablet Take 1 tablet (25 mg total) by mouth daily. 07/20/22  Yes Anner Alm ORN, MD  rivaroxaban  (XARELTO ) 20 MG TABS tablet Take 1 tablet (20 mg total) by mouth daily with supper. 11/09/22  Yes Anner Alm ORN, MD  sitaGLIPtin (JANUVIA) 50 MG tablet Take 50 mg by mouth daily.   Yes [provider]  B Complex Vitamins (VITAMIN B-COMPLEX) TABS Take 1 tablet by mouth daily. Patient not taking: Reported on 12/07/2023 05/24/22   [provider]  COLACE 100 MG capsule Take 100 mg by mouth daily. Patient not taking: Reported on 12/07/2023 05/24/22   [provider]  losartan  (COZAAR ) 25 MG tablet Take 0.5 tablets (12.5 mg total) by mouth daily. 07/20/22 12/07/23  Anner Alm ORN, MD    Allergies as of 12/18/2023   (No Known Allergies)    Family History  Problem Relation Age of Onset   Stroke Mother    Breast cancer Neg Hx     Social History   Socioeconomic History   Marital status: Married    Spouse name: Not  on file   Number of children: Not on file   Years of education: Not on file   Highest education level: Not on file  Occupational History   Not on file  Tobacco Use   Smoking status: Never   Smokeless tobacco: Never  Vaping Use   Vaping status: Never Used  Substance and Sexual Activity   Alcohol use: Not Currently   Drug use: Never   Sexual activity: Not on file  Other Topics Concern   Not on file  Social History Narrative   She and her husband are originally from  [place].  They do not speak English.      Due to language barrier history, not able to obtain much information.      She has less than high school graduate level education.   Social Drivers of Corporate Investment Banker Strain: Not on file  Food Insecurity: Not on file  Transportation Needs: Not on file  Physical Activity: Not on file  Stress: Not on file  Social Connections: Not on file  Intimate Partner Violence: Not on file    Review of Systems: See HPI, otherwise negative ROS  Physical Exam: BP (!) 165/64   Pulse (!) 58   Temp (!) 97.5 F (36.4 C) (Temporal)   Resp 18   Ht 5' 2 (1.575 m)   Wt 67.6 kg   SpO2 97%   BMI 27.25 kg/m  General:   Alert, cooperative. Head:  Normocephalic and atraumatic. Respiratory:  Normal work of breathing. Cardiovascular:  NAD  Impression/Plan: Kristi Conrad is here for cataract surgery.  Risks, benefits, limitations, and alternatives regarding cataract surgery have been reviewed with the patient.  Questions have been answered.  All parties agreeable.   Elsie Carmine, MD  01/08/2024, 9:14 AM

## 2024-01-08 NOTE — Op Note (Signed)
 PREOPERATIVE DIAGNOSIS:  Nuclear sclerotic cataract of the left eye.   POSTOPERATIVE DIAGNOSIS:  Nuclear sclerotic cataract of the left eye.   OPERATIVE PROCEDURE:ORPROCALL@   SURGEON:  Elsie Carmine, MD.   ANESTHESIA:  Anesthesiologist: Shellie Odor, MD CRNA: Levy Harvey, CRNA  1.      Managed anesthesia care. 2.     0.50ml of Shugarcaine was instilled following the paracentesis   COMPLICATIONS:  None.   TECHNIQUE:   Stop and chop   DESCRIPTION OF PROCEDURE:  The patient was examined and consented in the preoperative holding area where the aforementioned topical anesthesia was applied to the left eye and then brought back to the Operating Room where the left eye was prepped and draped in the usual sterile ophthalmic fashion and a lid speculum was placed. A paracentesis was created with the side port blade and the anterior chamber was filled with viscoelastic. A near clear corneal incision was performed with the steel keratome. A continuous curvilinear capsulorrhexis was performed with a cystotome followed by the capsulorrhexis forceps. Hydrodissection and hydrodelineation were carried out with BSS on a blunt cannula. The lens was removed in a stop and chop  technique and the remaining cortical material was removed with the irrigation-aspiration handpiece. The capsular bag was inflated with viscoelastic and the intraocular lens was placed in the capsular bag without complication. The remaining viscoelastic was removed from the eye with the irrigation-aspiration handpiece. The wounds were hydrated. The anterior chamber was flushed with BSS and the eye was inflated to physiologic pressure. 0.33ml Vigamox was placed in the anterior chamber. The wounds were found to be water  tight. The eye was dressed with Combigan. The patient was given protective glasses to wear throughout the day and a shield with which to sleep tonight. The patient was also given drops with which to begin a drop regimen today  and will follow-up with me in one day. Implant Name Type Inv. Item Serial No. Manufacturer Lot No. LRB No. Used Action  LENS IOL TECNIS EYHANCE 23.0 - [id number] Intraocular Lens LENS IOL TECNIS EYHANCE 23.0 [id number] SIGHTPATH  Left 1 Implanted    Procedure(s): PHACOEMULSIFICATION, CATARACT, WITH IOL INSERTION 7.29 00:46.3 (Left)  Electronically signed: Elsie Carmine 01/08/2024 9:39 AM

## 2024-01-08 NOTE — Anesthesia Postprocedure Evaluation (Signed)
 Anesthesia Post Note  Patient: MOMO BRAUN [place]  Procedure(s) Performed: PHACOEMULSIFICATION, CATARACT, WITH IOL INSERTION 7.29 00:46.3 (Left: Eye)  Patient location during evaluation: PACU Anesthesia Type: MAC Level of consciousness: awake and alert, oriented and patient cooperative Pain management: pain level controlled Vital Signs Assessment: post-procedure vital signs reviewed and stable Respiratory status: spontaneous breathing, nonlabored ventilation and respiratory function stable Cardiovascular status: blood pressure returned to baseline and stable Postop Assessment: adequate PO intake Anesthetic complications: no   No notable events documented.   Last Vitals:  Vitals:   01/08/24 0940 01/08/24 0945  BP: (!) 150/65 (!) 144/61  Pulse: 66 (!) 50  Resp: 14 12  Temp: (!) 36.1 C (!) 36.1 C  SpO2: 96% 95%    Last Pain:  Vitals:   01/08/24 0945  TempSrc:   PainSc: 0-No pain                 Alfonso Ruths

## 2024-01-08 NOTE — Transfer of Care (Signed)
 Immediate Anesthesia Transfer of Care Note  Patient: Kristi Conrad [place]  Procedure(s) Performed: PHACOEMULSIFICATION, CATARACT, WITH IOL INSERTION 7.29 00:46.3 (Left: Eye)  Patient Location: PACU  Anesthesia Type: MAC  Level of Consciousness: awake, alert  and patient cooperative  Airway and Oxygen Therapy: Patient Spontanous Breathing and Patient connected to supplemental oxygen  Post-op Assessment: Post-op Vital signs reviewed, Patient's Cardiovascular Status Stable, Respiratory Function Stable, Patent Airway and No signs of Nausea or vomiting  Post-op Vital Signs: Reviewed and stable  Complications: No notable events documented.

## 2024-01-08 NOTE — Anesthesia Preprocedure Evaluation (Addendum)
 Anesthesia Evaluation  Patient identified by MRN, date of birth, ID band Patient awake    Reviewed: Allergy & Precautions, NPO status , Patient's Chart, lab work & pertinent test results  History of Anesthesia Complications Negative for: history of anesthetic complications  Airway Mallampati: IV   Neck ROM: Full    Dental  (+) Missing   Pulmonary neg pulmonary ROS   Pulmonary exam normal breath sounds clear to auscultation       Cardiovascular hypertension, +CHF (dilated CM, EF 50-55%)  Normal cardiovascular exam+ dysrhythmias (a fib on Xarelto ) + Valvular Problems/Murmurs (ASD s/p repair 1984)  Rhythm:Regular Rate:Normal     Neuro/Psych negative neurological ROS     GI/Hepatic negative GI ROS,,,  Endo/Other  diabetes, Type 2Hypothyroidism    Renal/GU negative Renal ROS     Musculoskeletal   Abdominal   Peds  Hematology negative hematology ROS (+)   Anesthesia Other Findings Cardiology note 12/07/23:  Dilated cardiomyopathy Echo January 2023 showed EF 50 to 55%, Grade II DD, mildly elevated PA pressure, mild MR.  Patient denies lower extremity edema, shortness of breath, orthopnea, PND.  Euvolemic and well compensated on exam. - Continue Toprol , losartan .   History of ASD repair Coronary CTA February 2023 showed atrial septal occluder device with no evidence of atrial septal leak.   PAF Onset December 2022.  Denies spontaneous bleeding concerns.  Patient denies palpitations.  - Continue Toprol , Xarelto . - CBC today.    Hyperlipidemia LDL 142 April 2025.   - Continue atorvastatin . - Lipid panel and CMP today.    Hypertension BP today 140/70. Patient has not taken medications yet today. No headaches or dizziness.  - Continue Toprol , losartan .    Leg pain Patient has a history of varicose veins and is awaiting vein surgery.  Patient states she is no longer experiencing claudication. She would like to  defer ABIs. 2+ PT/DP pulses bilaterally.  - Cancel ABI order placed at last visit.    Disposition: CBC, CMP, lipid panel today. Return in 6 months or sooner as needed.    Reproductive/Obstetrics                              Anesthesia Physical Anesthesia Plan  ASA: 3  Anesthesia Plan: MAC   Post-op Pain Management:    Induction: Intravenous  PONV Risk Score and Plan: 2 and Treatment may vary due to age or medical condition, Midazolam  and TIVA  Airway Management Planned: Natural Airway and Nasal Cannula  Additional Equipment:   Intra-op Plan:   Post-operative Plan:   Informed Consent: I have reviewed the patients History and Physical, chart, labs and discussed the procedure including the risks, benefits and alternatives for the proposed anesthesia with the patient or authorized representative who has indicated his/her understanding and acceptance.     Dental advisory given  Plan Discussed with: CRNA  Anesthesia Plan Comments: (History and consent obtained in Spanish. LMA/GETA backup discussed.  Patient consented for risks of anesthesia including but not limited to:  - adverse reactions to medications - damage to eyes, teeth, lips or other oral mucosa - nerve damage due to positioning  - sore throat or hoarseness - damage to heart, brain, nerves, lungs, other parts of body or loss of life  Informed patient about role of CRNA in peri- and intra-operative care.  Patient voiced understanding.)         Anesthesia Quick Evaluation

## 2024-01-14 ENCOUNTER — Encounter: Payer: Self-pay | Admitting: Ophthalmology

## 2024-01-14 NOTE — Anesthesia Preprocedure Evaluation (Addendum)
 Anesthesia Evaluation  Patient identified by MRN, date of birth, ID band Patient awake    Reviewed: Allergy & Precautions, H&P , NPO status , Patient's Chart, lab work & pertinent test results  Airway Mallampati: IV  TM Distance: <3 FB Neck ROM: Full    Dental no notable dental hx. (+) Missing   Pulmonary neg pulmonary ROS   Pulmonary exam normal breath sounds clear to auscultation       Cardiovascular hypertension, negative cardio ROS Normal cardiovascular exam Rhythm:Regular Rate:Normal  03-09-21 echo 1. Left ventricular ejection fraction, by estimation, is 50 to 55%. The  left ventricle has low normal function. The left ventricle has no regional  wall motion abnormalities. Left ventricular diastolic parameters are  consistent with Grade II diastolic  dysfunction (pseudonormalization).   2. Right ventricular systolic function is normal. The right ventricular  size is normal. There is mildly elevated pulmonary artery systolic  pressure. The estimated right ventricular systolic pressure is 36.8 mmHg.   3. The mitral valve is normal in structure. Mild mitral valve  regurgitation. No evidence of mitral stenosis.   4. Tricuspid valve regurgitation is mild to moderate.   5. The aortic valve is normal in structure. Aortic valve regurgitation is  mild. No aortic stenosis is present.   6. The inferior vena cava is normal in size with greater than 50%  respiratory variability, suggesting right atrial pressure of 3 mmHg.   Cardiology note 12/07/23:  Dilated cardiomyopathy Echo January 2023 showed EF 50 to 55%, Grade II DD, mildly elevated PA pressure, mild MR.  Patient denies lower extremity edema, shortness of breath, orthopnea, PND.  Euvolemic and well compensated on exam. - Continue Toprol , losartan .   History of ASD repair Coronary CTA February 2023 showed atrial septal occluder device with no evidence of atrial septal leak.    PAF Onset December 2022.  Denies spontaneous bleeding concerns.  Patient denies palpitations.  - Continue Toprol , Xarelto .     Neuro/Psych negative neurological ROS  negative psych ROS   GI/Hepatic negative GI ROS, Neg liver ROS,,,  Endo/Other  negative endocrine ROS    Renal/GU negative Renal ROS  negative genitourinary   Musculoskeletal negative musculoskeletal ROS (+)    Abdominal   Peds negative pediatric ROS (+)  Hematology negative hematology ROS (+)   Anesthesia Other Findings Previous cataract surgery 01-08-24 Dr. Shellie  Acquired hypothyroidism  Hypertension H/O congenital atrial septal defect (ASD) repair  Cardiomyopathy (HCC) Atrial fibrillation (HCC)  Positive ANA (antinuclear antibody) Varicose veins of both lower extremities Pre-diabetes Dilated cardiomyopathy (HCC)  Grade II diastolic dysfunction Mild mitral regurgitation by prior echocardiogram  Mild aortic regurgitation Moderate tricuspid regurgitation     Reproductive/Obstetrics negative OB ROS                              Anesthesia Physical Anesthesia Plan  ASA: 3  Anesthesia Plan: MAC   Post-op Pain Management:    Induction: Intravenous  PONV Risk Score and Plan:   Airway Management Planned: Natural Airway and Nasal Cannula  Additional Equipment:   Intra-op Plan:   Post-operative Plan:   Informed Consent: I have reviewed the patients History and Physical, chart, labs and discussed the procedure including the risks, benefits and alternatives for the proposed anesthesia with the patient or authorized representative who has indicated his/her understanding and acceptance.     Dental Advisory Given  Plan Discussed with: Anesthesiologist, CRNA and Surgeon  Anesthesia  Plan Comments: (Patient consented for risks of anesthesia including but not limited to:  - adverse reactions to medications - damage to eyes, teeth, lips or other oral mucosa - nerve  damage due to positioning  - sore throat or hoarseness - Damage to heart, brain, nerves, lungs, other parts of body or loss of life  Patient voiced understanding and assent.)         Anesthesia Quick Evaluation

## 2024-01-18 NOTE — Discharge Instructions (Signed)
 El cuidado despu?s de una operaci?n de cataratas ?Cataract Surgery, Care After (Spanish) ? ?Esta hoja le da informaci?n sobre c?mo cuidarse despu?s de su cirug?a. Su oftalm?logo puede darle instrucciones m?s espec?ficas tambi?n. Si tiene problemas o preguntas, comun?quese con su doctor en [facility], [phone]. ? ??Qu? puedo esperar despu?s de la cirug?a? ?Es normal tener: ?Picaz?n ?Sensaci?n de tener un cuerpo extra?o (se siente como un grano de Investment banker, corporate ojo) ?Secreci?n acuosa (lagrimeo excesivo) ?Sensibilidad a la luz y al tacto ?Moretones en el ojo o a su alrededor ?Visi?n ligeramente borrosa ? ?Siga estas instrucciones en casa: ?No se toque ni se frote los ojos. ?Es posible que le digan que use una pantalla protectora o lentes de sol para proteger sus ojos. ?No se ponga un lente de contacto en el ojo operado hasta que su doctor lo apruebe. ?Mantenga los p?rpados y la cara limpios y secos. ?No deje que el agua le caiga directamente en la cara mientras se duche. ?Evite el jab?n y champ? en los ojos. ?No se maquille los ojos por [employer]. ? ?Rev?sese su ojo todos los d?as para ver si hay  signos de infecci?n. Est? atento(a): ?Enrojecimiento, hinchaz?n o dolor. ?L?quido, sangre o pus. ?Empeoramiento de la visi?n. ?Aumento de la sensibilidad a la luz o al tacto. ? ?Actividad: ?Physicist, medical d?a, evite agacharse y leer. Puede volver a leer y a agacharse al d?a siguiente. ?No maneje ni use maquinaria pesada durante al menos 24 horas. ?Evite las actividades vigorosas durante una semana. Est? bien Soil scientist, usar la cinta de correr, usar la bicicleta est?tica y [employer]. ?No levante objetos pesados (m?s de 20 libras) por una semana. ?No realice trabajos de jardiner?a ni tareas dom?sticas sucias en la casa (como limpiar los pisos, los ba?os, pasar la aspiradora, etc.) durante una semana. ?No nade ni use un jacuzzi durante 2 semanas.  ?Pregunte a su doctor cu?ndo  usted puede volver a trabajar. ? ?Instrucciones generales: ?Tome o aplique los medicamentos recetados y de venta libre seg?n le indique su doctor, incluyendo las gotas para los ojos y las pomadas. ?Contin?e tomando los medicamentos que fueron suspendidos antes de la cirug?a, a menos que su doctor le indique lo contrario. ?Vaya a todas sus citas de seguimiento que ya fueron programadas. ? ? ?P?ngase en contacto con un proveedor de atenci?n m?dica si: ?Le aparecen m?s moretones alrededor del ojo. ?Tiene dolor que no se Systems analyst. ?Tiene fiebre. ?Le sale l?quido, pus o sangre del ojo o de la incisi?n. ?Su sensibilidad a la Actor. ?Tiene manchas (moscas volantes) o destellos de luz en la vista. ?Tiene n?useas o v?mitos. ? ?Vaya al [facility]cias m?s cercana o llame al 911 si: ?Pierde la vista de forma repentina. ?Tiene un dolor de ojo que es intenso y que se est? empeorando. ?

## 2024-01-22 HISTORY — DX: Rheumatic tricuspid insufficiency: I07.1

## 2024-01-22 HISTORY — DX: Nonrheumatic aortic (valve) insufficiency: I35.1

## 2024-01-22 HISTORY — DX: Nonrheumatic mitral (valve) insufficiency: I34.0

## 2024-01-29 ENCOUNTER — Encounter: Payer: Self-pay | Admitting: Ophthalmology

## 2024-01-29 ENCOUNTER — Ambulatory Visit
Admission: RE | Admit: 2024-01-29 | Discharge: 2024-01-29 | Disposition: A | Discharge status: Home / Self Care | Attending: Ophthalmology | Admitting: Ophthalmology

## 2024-01-29 ENCOUNTER — Other Ambulatory Visit: Payer: Self-pay

## 2024-01-29 ENCOUNTER — Ambulatory Visit: Payer: Self-pay | Admitting: Anesthesiology

## 2024-01-29 ENCOUNTER — Encounter
Admission: RE | Disposition: A | Discharge status: Home / Self Care | Payer: Self-pay | Source: Home / Self Care | Attending: Ophthalmology

## 2024-01-29 DIAGNOSIS — H2511 Age-related nuclear cataract, right eye: Secondary | ICD-10-CM | POA: Diagnosis present

## 2024-01-29 DIAGNOSIS — I10 Essential (primary) hypertension: Secondary | ICD-10-CM | POA: Diagnosis not present

## 2024-01-29 HISTORY — PX: CATARACT EXTRACTION W/PHACO: SHX586

## 2024-01-29 LAB — GLUCOSE, CAPILLARY: Glucose-Capillary: 111 mg/dL — ABNORMAL HIGH (ref 70–99)

## 2024-01-29 SURGERY — PHACOEMULSIFICATION, CATARACT, WITH IOL INSERTION
Anesthesia: Monitor Anesthesia Care | Site: Eye | Laterality: Right

## 2024-01-29 MED ORDER — BRIMONIDINE TARTRATE-TIMOLOL 0.2-0.5 % OP SOLN
OPHTHALMIC | Status: DC | PRN
Start: 2024-01-29 — End: 2024-01-29
  Administered 2024-01-29: 1 [drp] via OPHTHALMIC

## 2024-01-29 MED ORDER — MIDAZOLAM HCL (PF) 2 MG/2ML IJ SOLN
INTRAMUSCULAR | Status: DC | PRN
  Administered 2024-01-29: 1 mg via INTRAVENOUS

## 2024-01-29 MED ORDER — FENTANYL CITRATE (PF) 100 MCG/2ML IJ SOLN
INTRAMUSCULAR | Status: DC | PRN
  Administered 2024-01-29: 50 ug via INTRAVENOUS

## 2024-01-29 MED ORDER — FENTANYL CITRATE (PF) 100 MCG/2ML IJ SOLN
INTRAMUSCULAR | Status: AC
  Filled 2024-01-29: qty 2

## 2024-01-29 MED ORDER — MOXIFLOXACIN HCL 0.5 % OP SOLN
OPHTHALMIC | Status: DC | PRN
  Administered 2024-01-29: .2 mL via OPHTHALMIC

## 2024-01-29 MED ORDER — SIGHTPATH DOSE#1 BSS IO SOLN
INTRAOCULAR | Status: DC | PRN
  Administered 2024-01-29: 15 mL via INTRAOCULAR

## 2024-01-29 MED ORDER — TETRACAINE HCL 0.5 % OP SOLN
1.0000 [drp] | OPHTHALMIC | Status: DC | PRN
  Administered 2024-01-29 (×3): 1 [drp] via OPHTHALMIC

## 2024-01-29 MED ORDER — SIGHTPATH DOSE#1 BSS IO SOLN
INTRAOCULAR | Status: DC | PRN
  Administered 2024-01-29: 45 mL via OPHTHALMIC

## 2024-01-29 MED ORDER — CYCLOPENTOLATE HCL 2 % OP SOLN
1.0000 [drp] | OPHTHALMIC | Status: AC
  Administered 2024-01-29 (×3): 1 [drp] via OPHTHALMIC

## 2024-01-29 MED ORDER — PHENYLEPHRINE HCL 10 % OP SOLN
1.0000 [drp] | OPHTHALMIC | Status: AC
  Administered 2024-01-29 (×3): 1 [drp] via OPHTHALMIC

## 2024-01-29 MED ORDER — MIDAZOLAM HCL 2 MG/2ML IJ SOLN
INTRAMUSCULAR | Status: AC
  Filled 2024-01-29: qty 2

## 2024-01-29 MED ORDER — LACTATED RINGERS IV SOLN: INTRAVENOUS | Status: DC

## 2024-01-29 MED ORDER — SIGHTPATH DOSE#1 NA CHONDROIT SULF-NA HYALURON 40-17 MG/ML IO SOLN
INTRAOCULAR | Status: DC | PRN
Start: 2024-01-29 — End: 2024-01-29
  Administered 2024-01-29: 1 mL via INTRAOCULAR

## 2024-01-29 MED ORDER — BALANCED SALT IO SOLN
INTRAOCULAR | Status: DC | PRN
  Administered 2024-01-29: 2 mL

## 2024-01-29 SURGICAL SUPPLY — 9 items
CANNULA ANT/CHMB 27G (MISCELLANEOUS) ×1 IMPLANT
CYSTOTOME ANGL RVRS SHRT 25G (CUTTER) ×1 IMPLANT
FEE CATARACT SUITE SIGHTPATH (MISCELLANEOUS) ×1 IMPLANT
GLOVE BIOGEL PI IND STRL 8 (GLOVE) ×1 IMPLANT
GLOVE SURG LX STRL 8.0 MICRO (GLOVE) ×1 IMPLANT
GLOVE SURG SYN 6.5 PF PI BL (GLOVE) ×1 IMPLANT
LENS IOL TECNIS EYHANCE 23.0 (Intraocular Lens) IMPLANT
NDL FILTER BLUNT 18X1 1/2 (NEEDLE) ×1 IMPLANT
SYR 3ML LL SCALE MARK (SYRINGE) ×1 IMPLANT

## 2024-01-29 NOTE — Progress Notes (Signed)
 Today the history is gathered from: 0% - patient  100% - IOW [phone]  RECORDS SUMMARY: No records on file  REFERRING PHYSICIAN: Lane Arthea Locus, MD PRIMARY CARE PHYSICIAN:  Era Harlene Kung, NP   IMPRESSION/PLAN  [name] is a 73 y.o. female presenting for evaluation of  Assessment & Plan Mild cognitive impairment Memory score decreased from 20/29 to 17/22.  Patient with procedure on her eyes this morning, affecting ability to complete memory evaluation today.  Symptoms include short-term memory loss, repetition, and difficulty managing medications. No family history of memory loss.  - Ordered brain MRI without to evaluate for structural causes of memory loss. - Prescribed Aricept 5 mg once daily with breakfast to slow progression of memory loss. - Discussed potential side effects of Aricept, including stomach upset and vivid dreams.  Peripheral neuropathy of hands and feet Peripheral neuropathy with burning pain and swelling in hands and feet, worse at night. Symptoms include inability to straighten fingers and burning sensation. Gabapentin previously prescribed but not currently effective. Discussed nerve testing to evaluate for neuropathy or nerve compression. - Ordered nerve conduction studies for hands and feet to evaluate for neuropathy or nerve compression. - After you have been on Aricept for 1 week, start Lyrica. Take 25mg  twice daily for one week, then increase to 50mg  twice daily and continue.  - Instructed to monitor for drowsiness or dizziness and to report if these occur.   Follow-up in 3 months  Recording duration: 20 minutes   Medications previously tried:  p=3   CHIEF COMPLAINT & HPI  [name] is a 73 y.o. female presenting for evaluation of: Chief Complaint  Patient presents with  . Memory Loss   History of Present Illness Kristi Conrad is a 73 year old female who presents with memory difficulty and  neuropathy.  She has experienced significant changes in her memory over the past few years, often forgetting tasks such as what she intended to retrieve from the refrigerator. Her children have noticed frequent repetition in her conversations. She is able to perform daily activities like dressing, bathing, and cooking without difficulty. However, she struggles with managing her medications and requires assistance from her children. She does not drive. Her memory score today was seventeen out of twenty-two, which is lower than her previous score of twenty out of twenty-nine.  She experiences neuropathy characterized by pain and swelling in her hands, particularly affecting her fingers, which she cannot straighten. The symptoms are worse at night and include burning sensations in her hands and feet. She wakes up feeling as if her extremities are cold like ice. She was taking gabapentin previously, but it is unclear if it has been effective.    DATA SUMMARY: 02/02/2016 MR LUMBAR SPINE WO CONTRAST IMPRESSION:  Multilevel lumbar disc degeneration, greatest at L4-5 where there is  moderate spinal stenosis and left greater than right lateral recess  stenosis.   06/08/2014 CT HEAD WO CONTRAST IMPRESSION:  1. No acute intracranial findings or mass lesion.  2. Degenerative cervical spondylosis but no acute findings in the  cervical spine.   06/08/2014 CT CERVICAL SPINE W CONTRAST IMPRESSION:  1. No acute intracranial findings or mass lesion.  2. Degenerative cervical spondylosis but no acute findings in the  cervical spine.   01/19/2015 EMG Impression: This is a normal electrodiagnostic study of both upper limbs.  There is no electrodiagnostic evidence of cervical radiculopathy or carpal tunnel syndrome.   01/15/2015  EMG Impression: This is a normal electrodiagnostic study of both lower limbs.  There is no electrodiagnostic evidence of generalized peripheral neuropathy or lumbosacral  radiculopathy.  VISIT SUMMARIES:   MEDICATIONS Current Outpatient Medications  Medication Sig Dispense Refill  . atorvastatin  (LIPITOR) 40 MG tablet Take 40 mg by mouth once daily    . levothyroxine (SYNTHROID, LEVOTHROID) 50 MCG tablet Take 50 mcg by mouth once daily       . linaCLOtide (LINZESS) 145 mcg capsule Take 145 mcg by mouth once daily    . losartan  (COZAAR ) 25 MG tablet Take 12.5 mg by mouth once daily    . metoprolol  succinate (TOPROL -XL) 25 MG XL tablet Take 25 mg by mouth once daily    . rivaroxaban  (XARELTO ) 20 mg tablet Take by mouth daily with breakfast    . sertraline (ZOLOFT) 50 MG tablet Take 50 mg by mouth once daily    . SITagliptin phosphate (JANUVIA) 25 MG tablet Take 25 mg by mouth once daily    . amitriptyline (ELAVIL) 25 MG tablet Take 1 tablet (25 mg total) by mouth nightly (Patient not taking: Reported on 12/14/2020) 30 tablet 5  . aspirin 81 MG EC tablet Take 1 tablet (81 mg total) by mouth once daily (Patient not taking: Reported on 01/29/2024) 30 tablet 11  . dicyclomine (BENTYL) 20 mg tablet Take 20 mg by mouth every 6 (six) hours (Patient not taking: Reported on 01/29/2024)    . donepeziL (ARICEPT) 5 MG tablet Take 1 tablet (5 mg total) by mouth at bedtime for 30 days 30 tablet 3  . ibuprofen (ADVIL,MOTRIN) 600 MG tablet Take 1 tablet (600 mg total) by mouth every 6 (six) hours as needed. (Patient not taking: Reported on 01/29/2024) 90 tablet 3  . naproxen  (NAPROSYN ) 500 MG tablet Take 1 tablet (500 mg total) by mouth 2 (two) times daily with meals (Patient not taking: Reported on 01/29/2024) 60 tablet 0  . polyethylene glycol (MIRALAX) powder Take 17 g by mouth once daily Mix in 4-8ounces of fluid prior to taking. (Patient not taking: Reported on 01/29/2024)    . pregabalin (LYRICA) 25 MG capsule Take 25mg  twice daily for one week, then increase to 50mg  twice daily and continue. 120 capsule 2  . SITagliptin phosphate (JANUVIA) 50 MG tablet Take 50 mg by  mouth once daily (Patient not taking: Reported on 01/29/2024)     No current facility-administered medications for this visit.    ALLERGIES No Known Allergies   EXAM   Vitals:   01/29/24 1345  BP: 120/62  Height: 154.9 cm (5' 1)  PainSc: 10-Worst pain ever  PainLoc: Hand    Body mass index is 28.15 kg/m.  Memory Evaluation 01/29/2024 - 17/22 (omit mundo, no ifs and or buts, reading sentence and writing sentence due to education, language barrier, recent eye surgery) 12/14/2020 - 20/29; patient with third grade education and language barrier  GENERAL: very pleasant female, no acute distress. Normocephalic and atraumatic.  Baseline neurological exam below was obtained at prior office visit. Changes from today's visit appear in bold.    MUSCULOSKELETAL: Bulk - Normal Tone - Normal Pronator Drift - Not assessed Ambulation - Gait and station is steady Romberg - Not assessed  NEUROLOGICAL: MENTAL STATUS: Patient is oriented to person, place and time.   Short-term memory is very mildly diminished. Long-term memory is intact.   Attention span and concentration are intact.   Naming and repetition are intact. Comprehension is intact.   Expressive speech  is intact.   Patient's fund of knowledge is within normal limits for educational level.     PAST MEDICAL HISTORY Past Medical History:  Diagnosis Date  . Anxiety   . Depression   . GERD (gastroesophageal reflux disease)   . Hyperlipidemia   . Neuropathy   . Sleep apnea   . Thyroid disease     PAST SURGICAL HISTORY Past Surgical History:  Procedure Laterality Date  . WRIST SURGERY Left 2025  . ASD REPAIR    . CARDIAC SURGERY    . Heart Valve Surgery     30 yrs ago in [place]  . HYSTERECTOMY    . THYROIDECTOMY TOTAL    . Wisdom Teeth Removed      FAMILY HISTORY Family History  Problem Relation Name Age of Onset  . Stroke Mother    . No Known Problems Father      SOCIAL HISTORY  Social History    Tobacco Use  . Smoking status: Never  . Smokeless tobacco: Never  Vaping Use  . Vaping status: Never Used  Substance Use Topics  . Alcohol use: No    Alcohol/week: 0.0 standard drinks of alcohol  . Drug use: No     REVIEW OF SYSTEMS:  13 system ROS form was given to the patient to complete and I have reviewed it.  The form was sent for scan to the patient's EHR.  Pertinent positives and negatives are mentioned above in the HPI and all other systems are negative.   DATA  I have personally reviewed all of the data outlined below both prior to the appointment and during the appointment with the patient as appropriate.  No visits with results within 6 Month(s) from this visit.  Latest known visit with results is:  Office Visit on 03/14/2021  Component Date Value Ref Range Status  . Vitamin B12 03/14/2021 438  >300 pg/mL Final  . Ferritin 03/14/2021 204  11 - 307 ng/mL Final  . Magnesium 03/14/2021 2.1  1.8 - 2.5 mg/dL Final  . Thyroid Stimulating Hormone (TSH) 03/14/2021 1.846  0.450-5.330 uIU/ml uIU/mL Final      No follow-ups on file.  Payor: MEDICARE / Plan: MEDICARE A AND B / Product Type: Medicare /   This note is partially written by Roe Mater, in the presence of and acting as the scribe of Kristi Rocks, PA-C.

## 2024-01-29 NOTE — Transfer of Care (Signed)
 Immediate Anesthesia Transfer of Care Note  Patient: Kristi Conrad de Cuzco  Procedure(s) Performed: PHACOEMULSIFICATION, CATARACT, WITH IOL INSERTION 5.85 00:46.2 (Right: Eye)  Patient Location: PACU  Anesthesia Type: MAC  Level of Consciousness: awake, alert  and patient cooperative  Airway and Oxygen Therapy: Patient Spontanous Breathing and Patient connected to supplemental oxygen  Post-op Assessment: Post-op Vital signs reviewed, Patient's Cardiovascular Status Stable, Respiratory Function Stable, Patent Airway and No signs of Nausea or vomiting  Post-op Vital Signs: Reviewed and stable  Complications: No notable events documented.

## 2024-01-29 NOTE — Anesthesia Postprocedure Evaluation (Signed)
 Anesthesia Post Note  Patient: Kristi Conrad  Procedure(s) Performed: PHACOEMULSIFICATION, CATARACT, WITH IOL INSERTION 5.85 00:46.2 (Right: Eye)  Patient location during evaluation: PACU Anesthesia Type: MAC Level of consciousness: awake and alert Pain management: pain level controlled Vital Signs Assessment: post-procedure vital signs reviewed and stable Respiratory status: spontaneous breathing, nonlabored ventilation, respiratory function stable and patient connected to nasal cannula oxygen Cardiovascular status: stable and blood pressure returned to baseline Postop Assessment: no apparent nausea or vomiting Anesthetic complications: no   No notable events documented.   Last Vitals:  Vitals:   01/29/24 0812 01/29/24 0817  BP: (!) 143/59 (!) 141/61  Pulse: (!) 53 (!) 54  Resp:  13  Temp: (!) 36.3 C (!) 36.3 C  SpO2: 96% 96%    Last Pain:  Vitals:   01/29/24 0817  TempSrc:   PainSc: 0-No pain                 Caven Perine C Wynema Garoutte

## 2024-01-29 NOTE — H&P (Signed)
 [facility]   Primary Care Physician:  Era Raisin, NP Ophthalmologist: Dr. Elsie Carmine  Pre-Procedure History & Physical: HPI:  Kristi Conrad is a 73 y.o. female here for cataract surgery.   Past Medical History:  Diagnosis Date   Acquired hypothyroidism    Atrial fibrillation (HCC) 02/2021   Cardiomyopathy (HCC) 01/2017   Echo showed mildly reduced EF of roughly 40%-increase to 45% with exercise.   Dilated cardiomyopathy (HCC)    Grade II diastolic dysfunction    H/O congenital atrial septal defect (ASD) repair 1984   In [place]   Hypertension    Mild aortic regurgitation    Mild mitral regurgitation by prior echocardiogram    Moderate tricuspid regurgitation    Positive ANA (antinuclear antibody)    Has polyneuropathy   Pre-diabetes    Varicose veins of both lower extremities    Actually seen by [facility]-November 2022    Past Surgical History:  Procedure Laterality Date   ABDOMINAL HYSTERECTOMY     ASD REPAIR  1984   Unknown details.  Was performed while living in [place].   CATARACT EXTRACTION W/PHACO Left 01/08/2024   Procedure: PHACOEMULSIFICATION, CATARACT, WITH IOL INSERTION 7.29 00:46.3;  Surgeon: Carmine Elsie, MD;  Location: [facility];  Service: Ophthalmology;  Laterality: Left;   CHOLECYSTECTOMY     COLONOSCOPY WITH PROPOFOL  N/A 11/30/2017   Procedure: COLONOSCOPY WITH PROPOFOL ;  Surgeon: Janalyn Keene NOVAK, MD;  Location: [facility];  Service: Endoscopy;  Laterality: N/A;   COLONOSCOPY WITH PROPOFOL  N/A 04/14/2019   Procedure: COLONOSCOPY WITH PROPOFOL ;  Surgeon: Unk Corinn Skiff, MD;  Location: [facility];  Service: Endoscopy;  Laterality: N/A;   ESOPHAGOGASTRODUODENOSCOPY (EGD) WITH PROPOFOL  N/A 11/30/2017   Procedure: ESOPHAGOGASTRODUODENOSCOPY (EGD) WITH PROPOFOL ;  Surgeon: Janalyn Keene NOVAK, MD;  Location: [facility];  Service: Endoscopy;  Laterality: N/A;    ESOPHAGOGASTRODUODENOSCOPY (EGD) WITH PROPOFOL  N/A 04/14/2019   Procedure: ESOPHAGOGASTRODUODENOSCOPY (EGD) WITH PROPOFOL ;  Surgeon: Unk Corinn Skiff, MD;  Location: [facility];  Service: Endoscopy;  Laterality: N/A;   NM MYOVIEW  LTD Bilateral 2016   [facility]: Normal LV function.  No ischemia.   Stress Echocardiogram  01/2017   ([facility]) mildly reduced LV function.  EF 40%.  Improved to 45% with exercise.   WRIST FRACTURE SURGERY Left     Prior to Admission medications   Medication Sig Start Date End Date Taking? Authorizing Provider  atorvastatin  (LIPITOR) 40 MG tablet Take 1 tablet (40 mg total) by mouth daily. 08/07/23 01/29/24 Yes Wittenborn, Barnie, NP  levothyroxine (SYNTHROID, LEVOTHROID) 50 MCG tablet Take 50 mcg by mouth daily before breakfast. 08/21/17  Yes [provider]  losartan  (COZAAR ) 25 MG tablet Take 0.5 tablets (12.5 mg total) by mouth daily. 07/20/22 01/29/24 Yes Anner Alm ORN, MD  metoprolol  succinate (TOPROL -XL) 25 MG 24 hr tablet Take 1 tablet (25 mg total) by mouth daily. 07/20/22  Yes Anner Alm ORN, MD  rivaroxaban  (XARELTO ) 20 MG TABS tablet Take 1 tablet (20 mg total) by mouth daily with supper. 11/09/22  Yes Anner Alm ORN, MD  sitaGLIPtin (JANUVIA) 50 MG tablet Take 50 mg by mouth daily.   Yes [provider]  B Complex Vitamins (VITAMIN B-COMPLEX) TABS Take 1 tablet by mouth daily. Patient not taking: Reported on 12/07/2023 05/24/22   [provider]  COLACE 100 MG capsule Take 100 mg by mouth daily. Patient not taking: Reported on 12/07/2023 05/24/22   [provider]  Allergies as of 12/18/2023   (No Known Allergies)    Family History  Problem Relation Age of Onset   Stroke Mother    Breast cancer Neg Hx     Social History   Socioeconomic History   Marital status: Married    Spouse name: Not on file   Number of children: Not on file   Years of education: Not on file   Highest education level:  Not on file  Occupational History   Not on file  Tobacco Use   Smoking status: Never   Smokeless tobacco: Never  Vaping Use   Vaping status: Never Used  Substance and Sexual Activity   Alcohol use: Not Currently   Drug use: Never   Sexual activity: Not on file  Other Topics Concern   Not on file  Social History Narrative   She and her husband are originally from [place].  They do not speak English.      Due to language barrier history, not able to obtain much information.      She has less than high school graduate level education.   Social Drivers of Corporate Investment Banker Strain: Not on file  Food Insecurity: Not on file  Transportation Needs: Not on file  Physical Activity: Not on file  Stress: Not on file  Social Connections: Not on file  Intimate Partner Violence: Not on file    Review of Systems: See HPI, otherwise negative ROS  Physical Exam: BP (!) 138/95   Temp 98.3 F (36.8 C) (Temporal)   Resp 12   Ht 5' 2.01 (1.575 m)   Wt 67.9 kg   SpO2 96%   BMI 27.39 kg/m  General:   Alert, cooperative. Head:  Normocephalic and atraumatic. Respiratory:  Normal work of breathing. Cardiovascular:  NAD  Impression/Plan: Kristi Conrad is here for cataract surgery.  Risks, benefits, limitations, and alternatives regarding cataract surgery have been reviewed with the patient.  Questions have been answered.  All parties agreeable.   Elsie Carmine, MD  01/29/2024, 7:51 AM

## 2024-01-29 NOTE — Op Note (Signed)
 PREOPERATIVE DIAGNOSIS:  Nuclear sclerotic cataract of the right eye.   POSTOPERATIVE DIAGNOSIS:  Right Eye Cataract   OPERATIVE PROCEDURE:ORPROCALL@   SURGEON:  Kristi Carmine, MD.   ANESTHESIA:  Anesthesiologist: Ola Donny BROCKS, MD CRNA: Jahoo, Sonia, CRNA  1.      Managed anesthesia care. 2.      0.13ml of Shugarcaine was instilled in the eye following the paracentesis.   COMPLICATIONS:  None.   TECHNIQUE:   Stop and chop   DESCRIPTION OF PROCEDURE:  The patient was examined and consented in the preoperative holding area where the aforementioned topical anesthesia was applied to the right eye and then brought back to the Operating Room where the right eye was prepped and draped in the usual sterile ophthalmic fashion and a lid speculum was placed. A paracentesis was created with the side port blade and the anterior chamber was filled with viscoelastic. A near clear corneal incision was performed with the steel keratome. A continuous curvilinear capsulorrhexis was performed with a cystotome followed by the capsulorrhexis forceps. Hydrodissection and hydrodelineation were carried out with BSS on a blunt cannula. The lens was removed in a stop and chop  technique and the remaining cortical material was removed with the irrigation-aspiration handpiece. The capsular bag was inflated with viscoelastic and the intraocular lens was placed in the capsular bag without complication. The remaining viscoelastic was removed from the eye with the irrigation-aspiration handpiece. The wounds were hydrated. The anterior chamber was flushed with BSS and the eye was inflated to physiologic pressure. 0.68ml of Vigamox  was placed in the anterior chamber. The wounds were found to be water  tight. The eye was dressed with Combigan . The patient was given protective glasses to wear throughout the day and a shield with which to sleep tonight. The patient was also given drops with which to begin a drop regimen today and  will follow-up with me in one day. Implant Name Type Inv. Item Serial No. Manufacturer Lot No. LRB No. Used Action  LENS IOL TECNIS EYHANCE 23.0 - [id number] Intraocular Lens LENS IOL TECNIS EYHANCE 23.0 [id number] [employer]  Right 1 Implanted   Procedure(s): PHACOEMULSIFICATION, CATARACT, WITH IOL INSERTION 5.85 00:46.2 (Right)  Electronically signed: Elsie Conrad 01/29/2024 8:11 AM

## 2024-02-05 ENCOUNTER — Other Ambulatory Visit: Payer: Self-pay | Admitting: Neurology

## 2024-02-05 ENCOUNTER — Ambulatory Visit
Admission: RE | Admit: 2024-02-05 | Discharge: 2024-02-05 | Disposition: A | Source: Ambulatory Visit | Attending: Neurology | Admitting: Neurology

## 2024-02-05 DIAGNOSIS — R413 Other amnesia: Secondary | ICD-10-CM | POA: Insufficient documentation
